# Patient Record
Sex: Female | Born: 1970 | Race: White | Hispanic: No | Smoking: Current every day smoker
Health system: Southern US, Community
[De-identification: ages and names within clinical notes are randomized; demographics above are authoritative.]

## PROBLEM LIST (undated history)

## (undated) DIAGNOSIS — K602 Anal fissure, unspecified: Secondary | ICD-10-CM

## (undated) DIAGNOSIS — J45909 Unspecified asthma, uncomplicated: Secondary | ICD-10-CM

## (undated) DIAGNOSIS — K635 Polyp of colon: Secondary | ICD-10-CM

## (undated) DIAGNOSIS — F32A Depression, unspecified: Secondary | ICD-10-CM

## (undated) DIAGNOSIS — D649 Anemia, unspecified: Secondary | ICD-10-CM

## (undated) DIAGNOSIS — E669 Obesity, unspecified: Secondary | ICD-10-CM

## (undated) DIAGNOSIS — K449 Diaphragmatic hernia without obstruction or gangrene: Secondary | ICD-10-CM

## (undated) DIAGNOSIS — Z86718 Personal history of other venous thrombosis and embolism: Secondary | ICD-10-CM

## (undated) DIAGNOSIS — F419 Anxiety disorder, unspecified: Secondary | ICD-10-CM

## (undated) DIAGNOSIS — I2699 Other pulmonary embolism without acute cor pulmonale: Secondary | ICD-10-CM

## (undated) DIAGNOSIS — I10 Essential (primary) hypertension: Secondary | ICD-10-CM

## (undated) DIAGNOSIS — F329 Major depressive disorder, single episode, unspecified: Secondary | ICD-10-CM

## (undated) DIAGNOSIS — J189 Pneumonia, unspecified organism: Secondary | ICD-10-CM

## (undated) DIAGNOSIS — K8020 Calculus of gallbladder without cholecystitis without obstruction: Secondary | ICD-10-CM

## (undated) HISTORY — PX: CHOLECYSTECTOMY: SHX55

## (undated) HISTORY — PX: TUBAL LIGATION: SHX77

## (undated) HISTORY — PX: ABLATION ON ENDOMETRIOSIS: SHX5787

## (undated) HISTORY — PX: ANKLE FRACTURE SURGERY: SHX122

## (undated) HISTORY — DX: Pneumonia, unspecified organism: J18.9

## (undated) HISTORY — DX: Diaphragmatic hernia without obstruction or gangrene: K44.9

## (undated) HISTORY — DX: Polyp of colon: K63.5

## (undated) HISTORY — DX: Calculus of gallbladder without cholecystitis without obstruction: K80.20

## (undated) HISTORY — DX: Obesity, unspecified: E66.9

## (undated) HISTORY — DX: Anemia, unspecified: D64.9

## (undated) HISTORY — DX: Anal fissure, unspecified: K60.2

## (undated) HISTORY — DX: Personal history of other venous thrombosis and embolism: Z86.718

## (undated) HISTORY — PX: UPPER GI ENDOSCOPY: SHX6162

---

## 1995-08-15 DIAGNOSIS — K8020 Calculus of gallbladder without cholecystitis without obstruction: Secondary | ICD-10-CM

## 1995-08-15 HISTORY — DX: Calculus of gallbladder without cholecystitis without obstruction: K80.20

## 2001-08-14 DIAGNOSIS — Z86718 Personal history of other venous thrombosis and embolism: Secondary | ICD-10-CM

## 2001-08-14 DIAGNOSIS — D649 Anemia, unspecified: Secondary | ICD-10-CM

## 2001-08-14 HISTORY — DX: Anemia, unspecified: D64.9

## 2001-08-14 HISTORY — PX: COLONOSCOPY: SHX174

## 2001-08-14 HISTORY — DX: Personal history of other venous thrombosis and embolism: Z86.718

## 2002-08-14 DIAGNOSIS — K449 Diaphragmatic hernia without obstruction or gangrene: Secondary | ICD-10-CM

## 2002-08-14 HISTORY — DX: Diaphragmatic hernia without obstruction or gangrene: K44.9

## 2003-08-15 DIAGNOSIS — K602 Anal fissure, unspecified: Secondary | ICD-10-CM

## 2003-08-15 DIAGNOSIS — K635 Polyp of colon: Secondary | ICD-10-CM

## 2003-08-15 HISTORY — DX: Polyp of colon: K63.5

## 2003-08-15 HISTORY — DX: Anal fissure, unspecified: K60.2

## 2008-08-14 HISTORY — PX: HIP FRACTURE SURGERY: SHX118

## 2013-07-14 DIAGNOSIS — J189 Pneumonia, unspecified organism: Secondary | ICD-10-CM

## 2013-07-14 HISTORY — DX: Pneumonia, unspecified organism: J18.9

## 2014-04-27 ENCOUNTER — Emergency Department (HOSPITAL_COMMUNITY): Payer: Self-pay

## 2014-04-27 ENCOUNTER — Encounter (HOSPITAL_COMMUNITY): Payer: Self-pay | Admitting: Emergency Medicine

## 2014-04-27 ENCOUNTER — Emergency Department (HOSPITAL_COMMUNITY)
Admission: EM | Admit: 2014-04-27 | Discharge: 2014-04-27 | Disposition: A | Discharge status: Home / Self Care | Payer: Self-pay | Attending: Emergency Medicine | Admitting: Emergency Medicine

## 2014-04-27 DIAGNOSIS — Z86711 Personal history of pulmonary embolism: Secondary | ICD-10-CM | POA: Insufficient documentation

## 2014-04-27 DIAGNOSIS — R079 Chest pain, unspecified: Secondary | ICD-10-CM | POA: Insufficient documentation

## 2014-04-27 DIAGNOSIS — J069 Acute upper respiratory infection, unspecified: Secondary | ICD-10-CM | POA: Insufficient documentation

## 2014-04-27 DIAGNOSIS — J45901 Unspecified asthma with (acute) exacerbation: Secondary | ICD-10-CM | POA: Insufficient documentation

## 2014-04-27 DIAGNOSIS — Z79899 Other long term (current) drug therapy: Secondary | ICD-10-CM | POA: Insufficient documentation

## 2014-04-27 DIAGNOSIS — B9789 Other viral agents as the cause of diseases classified elsewhere: Secondary | ICD-10-CM

## 2014-04-27 DIAGNOSIS — Z7901 Long term (current) use of anticoagulants: Secondary | ICD-10-CM | POA: Insufficient documentation

## 2014-04-27 DIAGNOSIS — I10 Essential (primary) hypertension: Secondary | ICD-10-CM | POA: Insufficient documentation

## 2014-04-27 DIAGNOSIS — R072 Precordial pain: Secondary | ICD-10-CM | POA: Insufficient documentation

## 2014-04-27 DIAGNOSIS — F17200 Nicotine dependence, unspecified, uncomplicated: Secondary | ICD-10-CM | POA: Insufficient documentation

## 2014-04-27 HISTORY — DX: Unspecified asthma, uncomplicated: J45.909

## 2014-04-27 HISTORY — DX: Other pulmonary embolism without acute cor pulmonale: I26.99

## 2014-04-27 HISTORY — DX: Essential (primary) hypertension: I10

## 2014-04-27 LAB — BASIC METABOLIC PANEL
ANION GAP: 11 (ref 5–15)
BUN: 9 mg/dL (ref 6–23)
CHLORIDE: 98 meq/L (ref 96–112)
CO2: 28 meq/L (ref 19–32)
Calcium: 9.1 mg/dL (ref 8.4–10.5)
Creatinine, Ser: 0.6 mg/dL (ref 0.50–1.10)
GFR calc Af Amer: >90 mL/min (ref >90)
GFR calc non Af Amer: >90 mL/min (ref >90)
GLUCOSE: 112 mg/dL — AB (ref 70–99)
Potassium: 4.2 mEq/L (ref 3.7–5.3)
Sodium: 137 mEq/L (ref 137–147)

## 2014-04-27 LAB — PRO B NATRIURETIC PEPTIDE: PRO B NATRI PEPTIDE: 65.4 pg/mL (ref 0–125)

## 2014-04-27 LAB — CBC
HCT: 41 % (ref 36.0–46.0)
HEMOGLOBIN: 13.6 g/dL (ref 12.0–15.0)
MCH: 31.1 pg (ref 26.0–34.0)
MCHC: 33.2 g/dL (ref 30.0–36.0)
MCV: 93.6 fL (ref 78.0–100.0)
PLATELETS: 291 10*3/uL (ref 150–400)
RBC: 4.38 MIL/uL (ref 3.87–5.11)
RDW: 14.6 % (ref 11.5–15.5)
WBC: 8.1 10*3/uL (ref 4.0–10.5)

## 2014-04-27 LAB — PROTIME-INR
INR: 1.58 — AB (ref 0.00–1.49)
PROTHROMBIN TIME: 18.9 s — AB (ref 11.6–15.2)

## 2014-04-27 LAB — I-STAT TROPONIN, ED: TROPONIN I, POC: 0 ng/mL (ref 0.00–0.08)

## 2014-04-27 MED ORDER — SULFAMETHOXAZOLE-TMP DS 800-160 MG PO TABS
1.0000 | ORAL_TABLET | Freq: Once | ORAL | Status: AC
  Administered 2014-04-27: 1 via ORAL
  Filled 2014-04-27: qty 1

## 2014-04-27 MED ORDER — ALBUTEROL SULFATE HFA 108 (90 BASE) MCG/ACT IN AERS: 2.0000 | INHALATION_SPRAY | Freq: Four times a day (QID) | RESPIRATORY_TRACT | Status: AC | PRN

## 2014-04-27 MED ORDER — ALBUTEROL SULFATE (2.5 MG/3ML) 0.083% IN NEBU
5.0000 mg | INHALATION_SOLUTION | Freq: Once | RESPIRATORY_TRACT | Status: AC
  Administered 2014-04-27: 5 mg via RESPIRATORY_TRACT
  Filled 2014-04-27: qty 6

## 2014-04-27 MED ORDER — ALBUTEROL SULFATE (2.5 MG/3ML) 0.083% IN NEBU: 5.0000 mg | INHALATION_SOLUTION | Freq: Once | RESPIRATORY_TRACT | Status: DC

## 2014-04-27 MED ORDER — DEXAMETHASONE SODIUM PHOSPHATE 10 MG/ML IJ SOLN
8.0000 mg | Freq: Once | INTRAMUSCULAR | Status: AC
  Administered 2014-04-27: 8 mg via INTRAMUSCULAR
  Filled 2014-04-27: qty 1

## 2014-04-27 MED ORDER — IPRATROPIUM-ALBUTEROL 0.5-2.5 (3) MG/3ML IN SOLN
3.0000 mL | Freq: Once | RESPIRATORY_TRACT | Status: AC
  Administered 2014-04-27: 3 mL via RESPIRATORY_TRACT
  Filled 2014-04-27: qty 3

## 2014-04-27 MED ORDER — IBUPROFEN 400 MG PO TABS
600.0000 mg | ORAL_TABLET | Freq: Once | ORAL | Status: DC
  Filled 2014-04-27 (×2): qty 1

## 2014-04-27 MED ORDER — ENOXAPARIN SODIUM 120 MG/0.8ML ~~LOC~~ SOLN
1.0000 mg/kg | Freq: Once | SUBCUTANEOUS | Status: AC
  Administered 2014-04-27: 110 mg via SUBCUTANEOUS
  Filled 2014-04-27: qty 0.8

## 2014-04-27 MED ORDER — ENOXAPARIN SODIUM 100 MG/ML ~~LOC~~ SOLN
107.0000 mg | Freq: Once | SUBCUTANEOUS | Status: DC
  Filled 2014-04-27: qty 2

## 2014-04-27 MED ORDER — HYDROCODONE-ACETAMINOPHEN 5-325 MG PO TABS
2.0000 | ORAL_TABLET | Freq: Once | ORAL | Status: AC
  Administered 2014-04-27: 2 via ORAL
  Filled 2014-04-27: qty 2

## 2014-04-27 MED ORDER — RIVAROXABAN 20 MG PO TABS
20.0000 mg | ORAL_TABLET | Freq: Every day | ORAL | Status: DC
Start: 2014-04-27 — End: 2014-07-27

## 2014-04-27 NOTE — ED Provider Notes (Signed)
Medical screening examination/treatment/procedure(s) were conducted as a shared visit with non-physician practitioner(s) or resident  and myself.  I personally evaluated the patient during the encounter and agree with the findings.   I have personally reviewed any xrays and/ or EKG's with the provider and I agree with interpretation.   Patient with gradually worsening cough, lower chest burning, bodyaches, subjective fever, chills, sick contact recently. On exam patient has expiratory wheezes bilateral, no distress, heart rate normal during my exam, no murmurs appreciated, abdomen soft nontender. Clinically patient with asthma  exacerbation/upper rest or infection. Chest x-ray reviewed no acute findings. Patient improved in ER with neb. Patient having financial difficulties and social work evaluated to help outpatient. plan for steroids, albuterol and outpatient followup. Clinically patient asthma exacerbation/upper respiratory infection. Social work made urgent appointment with local primary Dr. to ensure improvement of INR. Lovenox ordered in ER. Pt will fup with her hematologist to clarify which anticoagulant to continue.   Results and differential diagnosis were discussed with the patient/parent/guardian. Close follow up outpatient was discussed, comfortable with the plan.   Medications  enoxaparin (LOVENOX) injection 105 mg (not administered)  ipratropium-albuterol (DUONEB) 0.5-2.5 (3) MG/3ML nebulizer solution 3 mL (3 mLs Nebulization Given 04/27/14 1406)  HYDROcodone-acetaminophen (NORCO/VICODIN) 5-325 MG per tablet 2 tablet (2 tablets Oral Given 04/27/14 1450)  sulfamethoxazole-trimethoprim (BACTRIM DS) 800-160 MG per tablet 1 tablet (1 tablet Oral Given 04/27/14 1539)  albuterol (PROVENTIL) (2.5 MG/3ML) 0.083% nebulizer solution 5 mg (5 mg Nebulization Given 04/27/14 1545)  dexamethasone (DECADRON) injection 8 mg (8 mg Intramuscular Given 04/27/14 1600)  enoxaparin (LOVENOX) injection 110 mg (110  mg Subcutaneous Given 04/27/14 1637)    Filed Vitals:   04/27/14 1547 04/27/14 1559 04/27/14 1600 04/27/14 1639  BP: 103/67  112/77 116/68  Pulse: 67  77 82  Temp:    98.1 F (36.7 C)  TempSrc:    Oral  Resp: 15   18  Weight:  236 lb 12.8 oz (107.412 kg)    SpO2: 94%  90% 94%       Mariea Clonts, MD 04/27/14 1659

## 2014-04-27 NOTE — Progress Notes (Signed)
ED CM consulted with patient concerning fu care. Patient presented to [facility] ED with c/o SOB, CP with  Hx of PE on coumadin.  Spoke with patient she reports recently starting a new job,m insurance has not gone into effective. Patient does not have a PCP. Offered to assist patient with finding a PCP and anticoagulation meds. Patient is agreeable with disposition plan. Scheduled an appt. with [facility] for Tuesday 9/22 at [place] with  Dr.Zavitz regarding bridging to xarelto, he is agreeable. Provided patient with 30 day free xarelto card.  Reviewed clinic and xarelto information with patient,. She verbalized understanding teach back done. Updated Dr. Reather Converse and Benjamine Mola RN with disposition plan. No further ED CM needs identiifed

## 2014-04-27 NOTE — ED Provider Notes (Signed)
CSN: [id number]     Arrival date & time 04/27/14  1046 History   First MD Initiated Contact with Patient 04/27/14 1346     Chief Complaint  Patient presents with  . Chest Pain  . Shortness of Breath    (Consider location/radiation/quality/duration/timing/severity/associated sxs/prior Treatment) Patient is a 43 y.o. female presenting with chest pain. The history is provided by the patient.  Chest Pain Pain location:  Substernal area Pain quality: burning and pressure   Pain radiates to:  Does not radiate Pain radiates to the back: no   Pain severity:  Moderate Onset quality:  Gradual Duration:  2 days Progression:  Waxing and waning Context: breathing, at rest and stress   Context: not eating and no movement   Relieved by:  Nothing Worsened by:  Coughing and deep breathing Ineffective treatments: OTC cold meds and albuterol MDI. Associated symptoms: anxiety, cough, heartburn, orthopnea, shortness of breath and weakness   Associated symptoms: no altered mental status, no back pain, no dizziness, no fatigue, no fever, no lower extremity edema, no nausea, no numbness and no palpitations   Risk factors: hypertension, obesity, prior DVT/PE and smoking   Risk factors: no aortic disease, no birth control, no coronary artery disease, no diabetes mellitus, no high cholesterol and not pregnant     Past Medical History  Diagnosis Date  . PE (pulmonary embolism)   . Asthma   . Hypertension    History reviewed. No pertinent past surgical history. History reviewed. No pertinent family history. History  Substance Use Topics  . Smoking status: Current Every Day Smoker  . Smokeless tobacco: Not on file  . Alcohol Use: No   OB History   Grav Para Term Preterm Abortions TAB SAB Ect Mult Living                 Review of Systems  Constitutional: Negative for fever, chills, appetite change and fatigue.  HENT: Positive for congestion, postnasal drip and sinus pressure.   Respiratory:  Positive for cough, shortness of breath and wheezing. Negative for chest tightness.   Cardiovascular: Positive for chest pain and orthopnea. Negative for palpitations and leg swelling.  Gastrointestinal: Positive for heartburn. Negative for nausea.  Musculoskeletal: Negative for back pain.  Skin: Negative for rash.  Neurological: Positive for weakness. Negative for dizziness and numbness.      Allergies  Levaquin  Home Medications   Prior to Admission medications   Medication Sig Start Date End Date Taking? Authorizing Provider  diazepam (VALIUM) 10 MG tablet Take 10 mg by mouth every 6 (six) hours as needed for anxiety.   Yes Historical Provider, MD  escitalopram (LEXAPRO) 20 MG tablet Take 20 mg by mouth daily.   Yes Historical Provider, MD  lisinopril-hydrochlorothiazide (PRINZIDE,ZESTORETIC) 20-12.5 MG per tablet Take 1 tablet by mouth 2 (two) times daily.   Yes Historical Provider, MD  warfarin (COUMADIN) 10 MG tablet Take 10 mg by mouth daily.   Yes Historical Provider, MD  albuterol (PROVENTIL HFA;VENTOLIN HFA) 108 (90 BASE) MCG/ACT inhaler Inhale 2 puffs into the lungs every 6 (six) hours as needed for wheezing or shortness of breath. 04/27/14   Olam Idler, MD  albuterol (PROVENTIL) (2.5 MG/3ML) 0.083% nebulizer solution Take 6 mLs (5 mg total) by nebulization once. 04/27/14   Olam Idler, MD  rivaroxaban (XARELTO) 20 MG TABS tablet Take 1 tablet (20 mg total) by mouth daily with supper. 04/27/14   Olam Idler, MD   BP 103/67  Pulse 67  Temp(Src) 98.3 F (36.8 C) (Oral)  Resp 15  SpO2 94%  LMP 04/20/2014 Physical Exam  Vitals reviewed. Constitutional: She is oriented to person, place, and time. She appears well-developed and well-nourished. No distress.  HENT:  Mouth/Throat: Oropharynx is clear and moist.  Eyes: EOM are normal. Pupils are equal, round, and reactive to light.  Cardiovascular: Normal rate, regular rhythm and normal heart sounds.   No murmur  heard. Pulmonary/Chest: Effort normal. No respiratory distress. She has wheezes.  Abdominal: Soft. There is no tenderness.  Musculoskeletal: Normal range of motion. She exhibits no edema.  Neurological: She is alert and oriented to person, place, and time.  Skin: Skin is warm. No rash noted. She is not diaphoretic.    ED Course  Procedures (including critical care time) Labs Review Labs Reviewed  BASIC METABOLIC PANEL - Abnormal; Notable for the following:    Glucose, Bld 112 (*)    All other components within normal limits  PROTIME-INR - Abnormal; Notable for the following:    Prothrombin Time 18.9 (*)    INR 1.58 (*)    All other components within normal limits  CBC  PRO B NATRIURETIC PEPTIDE  I-STAT TROPOININ, ED    Imaging Review Dg Chest 2 View  04/27/2014   CLINICAL DATA:  Shortness of breath.  Asthma.  EXAM: CHEST  2 VIEW  COMPARISON:  06/15/2009.  FINDINGS: The cardiac silhouette, mediastinal and hilar contours are within normal limits. There is mild tortuosity of the thoracic aorta. The lungs are clear of acute process. No pleural effusion. There is mild eventration of the left hemidiaphragm. The bony thorax is intact.  IMPRESSION: No acute cardiopulmonary findings.   Electronically Signed   By: Kalman Jewels M.D.   On: 04/27/2014 12:20     EKG Interpretation   Date/Time:  Monday April 27 2014 10:51:46 EDT Ventricular Rate:  102 PR Interval:  116 QRS Duration: 78 QT Interval:  348 QTC Calculation: 453 R Axis:   63 Text Interpretation:  Sinus tachycardia Otherwise normal ECG Confirmed by  ZAVITZ  MD, JOSHUA ([id number]) on 04/27/2014 3:09:48 PM      MDM   Final diagnoses:  Chest pain, unspecified chest pain type  Asthma exacerbation  Viral URI with cough   Asthma exacerbation: Patient presents with viral URI symptoms x3 days, associated with substernal chest pain worse with coughing and deep breaths, as well as shortness of breath and wheezing.  Patient is  afebrile with normal WBC and chest x-ray negative for pneumonia.  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  INR currently subtherapeutic but report compliance with Coumadin and stable INR in past. Patient recently moved back to [place] due to new job and is currently without insurance. Initial tachycardia and hypoxia improved with DuoNebs.  Patient given dexamethasone and second dose of Duonebs prior to discharge.  Discussed, subtherapeutic INR.  The patient opted [facility] to [facility] for free 30 day prescription for Xarelto. She will establish PCP and follow-up with them for ongoing anticoagulation.  Patient given refills of albuterol MDI and nebulizer for asthma management.    Olam Idler, MD 04/27/14 1548  Olam Idler, MD 04/27/14 [phone]

## 2014-04-27 NOTE — ED Notes (Signed)
Per pt sts cold x 1 week and now has developed chest pressure, and SOB. sts feels like she cant get a deep breath. sts hx of PE.

## 2014-04-27 NOTE — Discharge Planning (Signed)
[place] to patient regarding primary care resources and establishing care with a provider. Patient states she should be obtaining insurance in the upcoming weeks through her employer. Resource guide and my contact information provided for any future questions or concerns. Spoke with Mariann Laster, Case Management about providing help with medication assistance.

## 2014-04-27 NOTE — Discharge Instructions (Signed)
Your chest pain and shortness of breath is due to asthma exacerbation likely to viral infection and cigarette smoke. This does not require antibiotics. You should continue to use you albuterol as needed and would recommend smoking cessation. Stop coumadin once you start Xarelto for your anticoagulation. Please establish a primary care doctor and follow-up with them from ongoing anticoagulation needs.   Asthma, Acute Bronchospasm Acute bronchospasm caused by asthma is also referred to as an asthma attack. Bronchospasm means your air passages become narrowed. The narrowing is caused by inflammation and tightening of the muscles in the air tubes (bronchi) in your lungs. This can make it hard to breathe or cause you to wheeze and cough. CAUSES Possible triggers are:  Animal dander from the skin, hair, or feathers of animals.  Dust mites contained in house dust.  Cockroaches.  Pollen from trees or grass.  Mold.  Cigarette or tobacco smoke.  Air pollutants such as dust, household cleaners, hair sprays, aerosol sprays, paint fumes, strong chemicals, or strong odors.  Cold air or weather changes. Cold air may trigger inflammation. Winds increase molds and pollens in the air.  Strong emotions such as crying or laughing hard.  Stress.  Certain medicines such as aspirin or beta-blockers.  Sulfites in foods and drinks, such as dried fruits and wine.  Infections or inflammatory conditions, such as a flu, cold, or inflammation of the nasal membranes (rhinitis).  Gastroesophageal reflux disease (GERD). GERD is a condition where stomach acid backs up into your esophagus.  Exercise or strenuous activity. SIGNS AND SYMPTOMS   Wheezing.  Excessive coughing, particularly at night.  Chest tightness.  Shortness of breath. DIAGNOSIS  Your health care provider will ask you about your medical history and perform a physical exam. A chest X-ray or blood testing may be performed to look for other  causes of your symptoms or other conditions that may have triggered your asthma attack. TREATMENT  Treatment is aimed at reducing inflammation and opening up the airways in your lungs. Most asthma attacks are treated with inhaled medicines. These include quick relief or rescue medicines (such as bronchodilators) and controller medicines (such as inhaled corticosteroids). These medicines are sometimes given through an inhaler or a nebulizer. Systemic steroid medicine taken by mouth or given through an IV tube also can be used to reduce the inflammation when an attack is moderate or severe. Antibiotic medicines are only used if a bacterial infection is present.  HOME CARE INSTRUCTIONS   Rest.  Drink plenty of liquids. This helps the mucus to remain thin and be easily coughed up. Only use caffeine in moderation and do not use alcohol until you have recovered from your illness.  Do not smoke. Avoid being exposed to secondhand smoke.  You play a critical role in keeping yourself in good health. Avoid exposure to things that cause you to wheeze or to have breathing problems.  Keep your medicines up-to-date and available. Carefully follow your health care provider's treatment plan.  Take your medicine exactly as prescribed.  When pollen or pollution is bad, keep windows closed and use an air conditioner or go to places with air conditioning.  Asthma requires careful medical care. See your health care provider for a follow-up as advised. If you are more than [redacted] weeks pregnant and you were prescribed any new medicines, let your obstetrician know about the visit and how you are doing. Follow up with your health care provider as directed.  After you have recovered from your asthma  attack, make an appointment with your outpatient doctor to talk about ways to reduce the likelihood of future attacks. If you do not have a doctor who manages your asthma, make an appointment with a primary care doctor to  discuss your asthma. SEEK IMMEDIATE MEDICAL CARE IF:   You are getting worse.  You have trouble breathing. If severe, call your local emergency services (911 in the [place]  You develop chest pain or discomfort.  You are vomiting.  You are not able to keep fluids down.  You are coughing up yellow, green, brown, or bloody sputum.  You have a fever and your symptoms suddenly get worse.  You have trouble swallowing. MAKE SURE YOU:   Understand these instructions.  Will watch your condition.  Will get help right away if you are not doing well or get worse. Document Released: 11/15/2006 Document Revised: 08/05/2013 Document Reviewed: 02/05/2013 [facility] Patient Information 2015 [place], [place]. This information is not intended to replace advice given to you by your health care provider. Make sure you discuss any questions you have with your health care provider.

## 2014-05-05 ENCOUNTER — Ambulatory Visit: Payer: Self-pay | Admitting: Family Medicine

## 2014-06-11 ENCOUNTER — Emergency Department (HOSPITAL_COMMUNITY): Payer: Self-pay

## 2014-06-11 ENCOUNTER — Encounter (HOSPITAL_COMMUNITY): Payer: Self-pay | Admitting: Emergency Medicine

## 2014-06-11 ENCOUNTER — Inpatient Hospital Stay (HOSPITAL_COMMUNITY)
Admission: EM | Admit: 2014-06-11 | Discharge: 2014-06-14 | DRG: 354 | Disposition: A | Discharge status: Home / Self Care | Payer: Self-pay | Attending: Surgery | Admitting: Surgery

## 2014-06-11 DIAGNOSIS — Z79899 Other long term (current) drug therapy: Secondary | ICD-10-CM

## 2014-06-11 DIAGNOSIS — F329 Major depressive disorder, single episode, unspecified: Secondary | ICD-10-CM | POA: Diagnosis present

## 2014-06-11 DIAGNOSIS — K430 Incisional hernia with obstruction, without gangrene: Secondary | ICD-10-CM | POA: Diagnosis present

## 2014-06-11 DIAGNOSIS — Z86711 Personal history of pulmonary embolism: Secondary | ICD-10-CM

## 2014-06-11 DIAGNOSIS — Z01818 Encounter for other preprocedural examination: Secondary | ICD-10-CM

## 2014-06-11 DIAGNOSIS — J45909 Unspecified asthma, uncomplicated: Secondary | ICD-10-CM | POA: Diagnosis present

## 2014-06-11 DIAGNOSIS — K436 Other and unspecified ventral hernia with obstruction, without gangrene: Principal | ICD-10-CM | POA: Diagnosis present

## 2014-06-11 DIAGNOSIS — K449 Diaphragmatic hernia without obstruction or gangrene: Secondary | ICD-10-CM | POA: Diagnosis present

## 2014-06-11 DIAGNOSIS — F419 Anxiety disorder, unspecified: Secondary | ICD-10-CM | POA: Diagnosis present

## 2014-06-11 DIAGNOSIS — Z6841 Body Mass Index (BMI) 40.0 and over, adult: Secondary | ICD-10-CM

## 2014-06-11 DIAGNOSIS — F17210 Nicotine dependence, cigarettes, uncomplicated: Secondary | ICD-10-CM | POA: Diagnosis present

## 2014-06-11 DIAGNOSIS — Z7901 Long term (current) use of anticoagulants: Secondary | ICD-10-CM

## 2014-06-11 DIAGNOSIS — K5900 Constipation, unspecified: Secondary | ICD-10-CM | POA: Diagnosis present

## 2014-06-11 DIAGNOSIS — K439 Ventral hernia without obstruction or gangrene: Secondary | ICD-10-CM

## 2014-06-11 DIAGNOSIS — I10 Essential (primary) hypertension: Secondary | ICD-10-CM | POA: Diagnosis present

## 2014-06-11 LAB — CBC WITH DIFFERENTIAL/PLATELET
BASOS PCT: 1 % (ref 0–1)
Basophils Absolute: 0.1 10*3/uL (ref 0.0–0.1)
Eosinophils Absolute: 0.1 10*3/uL (ref 0.0–0.7)
Eosinophils Relative: 1 % (ref 0–5)
HEMATOCRIT: 43.6 % (ref 36.0–46.0)
Hemoglobin: 14.3 g/dL (ref 12.0–15.0)
LYMPHS PCT: 32 % (ref 12–46)
Lymphs Abs: 3.3 10*3/uL (ref 0.7–4.0)
MCH: 31.6 pg (ref 26.0–34.0)
MCHC: 32.8 g/dL (ref 30.0–36.0)
MCV: 96.2 fL (ref 78.0–100.0)
MONO ABS: 0.6 10*3/uL (ref 0.1–1.0)
Monocytes Relative: 6 % (ref 3–12)
Neutro Abs: 6.3 10*3/uL (ref 1.7–7.7)
Neutrophils Relative %: 60 % (ref 43–77)
Platelets: 310 10*3/uL (ref 150–400)
RBC: 4.53 MIL/uL (ref 3.87–5.11)
RDW: 13.9 % (ref 11.5–15.5)
WBC: 10.4 10*3/uL (ref 4.0–10.5)

## 2014-06-11 LAB — URINALYSIS, ROUTINE W REFLEX MICROSCOPIC
Bilirubin Urine: NEGATIVE
GLUCOSE, UA: NEGATIVE mg/dL
Hgb urine dipstick: NEGATIVE
Ketones, ur: NEGATIVE mg/dL
LEUKOCYTES UA: NEGATIVE
Nitrite: NEGATIVE
PROTEIN: NEGATIVE mg/dL
Specific Gravity, Urine: 1.025 (ref 1.005–1.030)
Urobilinogen, UA: 0.2 mg/dL (ref 0.0–1.0)
pH: 5.5 (ref 5.0–8.0)

## 2014-06-11 LAB — COMPREHENSIVE METABOLIC PANEL
ALK PHOS: 63 U/L (ref 39–117)
ALT: 51 U/L — AB (ref 0–35)
AST: 41 U/L — ABNORMAL HIGH (ref 0–37)
Albumin: 3.5 g/dL (ref 3.5–5.2)
Anion gap: 11 (ref 5–15)
BILIRUBIN TOTAL: 0.3 mg/dL (ref 0.3–1.2)
BUN: 9 mg/dL (ref 6–23)
CHLORIDE: 102 meq/L (ref 96–112)
CO2: 27 meq/L (ref 19–32)
Calcium: 9.2 mg/dL (ref 8.4–10.5)
Creatinine, Ser: 0.65 mg/dL (ref 0.50–1.10)
GFR calc Af Amer: >90 mL/min (ref >90)
GLUCOSE: 88 mg/dL (ref 70–99)
Potassium: 4.6 mEq/L (ref 3.7–5.3)
SODIUM: 140 meq/L (ref 137–147)
Total Protein: 7.1 g/dL (ref 6.0–8.3)

## 2014-06-11 LAB — LIPASE, BLOOD: Lipase: 50 U/L (ref 11–59)

## 2014-06-11 LAB — OCCULT BLOOD X 1 CARD TO LAB, STOOL: Fecal Occult Bld: NEGATIVE

## 2014-06-11 LAB — I-STAT CG4 LACTIC ACID, ED: Lactic Acid, Venous: 1.4 mmol/L (ref 0.5–2.2)

## 2014-06-11 LAB — I-STAT BETA HCG BLOOD, ED (MC, WL, AP ONLY): I-stat hCG, quantitative: <5 m[IU]/mL (ref <5)

## 2014-06-11 LAB — TYPE AND SCREEN
ABO/RH(D): A POS
ANTIBODY SCREEN: NEGATIVE

## 2014-06-11 LAB — ABO/RH: ABO/RH(D): A POS

## 2014-06-11 MED ORDER — SODIUM CHLORIDE 0.9 % IV SOLN
1000.0000 mL | INTRAVENOUS | Status: DC
  Administered 2014-06-11: 1000 mL via INTRAVENOUS

## 2014-06-11 MED ORDER — ESCITALOPRAM OXALATE 20 MG PO TABS
20.0000 mg | ORAL_TABLET | Freq: Every day | ORAL | Status: DC
  Administered 2014-06-11 – 2014-06-14 (×3): 20 mg via ORAL
  Filled 2014-06-11 (×3): qty 1
  Filled 2014-06-11: qty 2

## 2014-06-11 MED ORDER — ONDANSETRON HCL 4 MG/2ML IJ SOLN
4.0000 mg | Freq: Four times a day (QID) | INTRAMUSCULAR | Status: DC | PRN
  Administered 2014-06-12 – 2014-06-13 (×3): 4 mg via INTRAVENOUS
  Filled 2014-06-11 (×3): qty 2

## 2014-06-11 MED ORDER — SODIUM CHLORIDE 0.9 % IV SOLN
1000.0000 mL | Freq: Once | INTRAVENOUS | Status: AC
  Administered 2014-06-11: 1000 mL via INTRAVENOUS

## 2014-06-11 MED ORDER — ONDANSETRON HCL 4 MG/2ML IJ SOLN
4.0000 mg | Freq: Once | INTRAMUSCULAR | Status: AC
  Administered 2014-06-11: 4 mg via INTRAVENOUS
  Filled 2014-06-11: qty 2

## 2014-06-11 MED ORDER — ACETAMINOPHEN 650 MG RE SUPP: 650.0000 mg | Freq: Four times a day (QID) | RECTAL | Status: DC | PRN

## 2014-06-11 MED ORDER — HYDROMORPHONE HCL 1 MG/ML IJ SOLN
1.0000 mg | INTRAMUSCULAR | Status: AC | PRN
  Administered 2014-06-11 (×3): 1 mg via INTRAVENOUS
  Filled 2014-06-11 (×3): qty 1

## 2014-06-11 MED ORDER — HYDROCHLOROTHIAZIDE 12.5 MG PO CAPS
12.5000 mg | ORAL_CAPSULE | Freq: Two times a day (BID) | ORAL | Status: DC
  Administered 2014-06-11 – 2014-06-14 (×5): 12.5 mg via ORAL
  Filled 2014-06-11 (×9): qty 1

## 2014-06-11 MED ORDER — ALBUTEROL SULFATE (2.5 MG/3ML) 0.083% IN NEBU
2.0000 mL | INHALATION_SOLUTION | Freq: Four times a day (QID) | RESPIRATORY_TRACT | Status: DC | PRN
  Administered 2014-06-14: 2 mL via RESPIRATORY_TRACT
  Filled 2014-06-11: qty 3

## 2014-06-11 MED ORDER — IOHEXOL 300 MG/ML  SOLN
25.0000 mL | INTRAMUSCULAR | Status: DC | PRN
  Administered 2014-06-11: 25 mL via ORAL

## 2014-06-11 MED ORDER — ACETAMINOPHEN 325 MG PO TABS: 650.0000 mg | ORAL_TABLET | Freq: Four times a day (QID) | ORAL | Status: DC | PRN

## 2014-06-11 MED ORDER — LORAZEPAM 2 MG/ML IJ SOLN
0.5000 mg | INTRAMUSCULAR | Status: DC | PRN
  Administered 2014-06-12 – 2014-06-13 (×2): 0.5 mg via INTRAVENOUS
  Filled 2014-06-11 (×2): qty 1

## 2014-06-11 MED ORDER — PANTOPRAZOLE SODIUM 40 MG IV SOLR
40.0000 mg | Freq: Every day | INTRAVENOUS | Status: DC
  Administered 2014-06-11 – 2014-06-13 (×3): 40 mg via INTRAVENOUS
  Filled 2014-06-11 (×6): qty 40

## 2014-06-11 MED ORDER — IOHEXOL 300 MG/ML  SOLN
100.0000 mL | Freq: Once | INTRAMUSCULAR | Status: AC | PRN
  Administered 2014-06-11: 100 mL via INTRAVENOUS

## 2014-06-11 MED ORDER — LISINOPRIL-HYDROCHLOROTHIAZIDE 20-12.5 MG PO TABS: 1.0000 | ORAL_TABLET | Freq: Two times a day (BID) | ORAL | Status: DC

## 2014-06-11 MED ORDER — LISINOPRIL 20 MG PO TABS
20.0000 mg | ORAL_TABLET | Freq: Two times a day (BID) | ORAL | Status: DC
  Administered 2014-06-11 – 2014-06-14 (×6): 20 mg via ORAL
  Filled 2014-06-11 (×9): qty 1

## 2014-06-11 MED ORDER — SODIUM CHLORIDE 0.9 % IV SOLN
INTRAVENOUS | Status: DC
  Administered 2014-06-11 – 2014-06-12 (×3): via INTRAVENOUS

## 2014-06-11 MED ORDER — OXYCODONE-ACETAMINOPHEN 5-325 MG PO TABS
2.0000 | ORAL_TABLET | Freq: Once | ORAL | Status: AC
  Administered 2014-06-11: 2 via ORAL
  Filled 2014-06-11: qty 2

## 2014-06-11 MED ORDER — HYDROMORPHONE HCL 1 MG/ML IJ SOLN
1.0000 mg | INTRAMUSCULAR | Status: DC | PRN
  Administered 2014-06-11 – 2014-06-13 (×12): 1 mg via INTRAVENOUS
  Filled 2014-06-11 (×13): qty 1

## 2014-06-11 NOTE — H&P (Addendum)
Courtney [name] is an 43 y.o. female.   Chief Complaint: ab pain HPI: 89 yof who is obese presents with 3 day history of noting a right paraumbilical mass that has been tender. This has not gotten better. The mass has always been present.  She has not had any n/v.  She has been having bms although somewhat constipated. She is passing flatus.  She comes in due to pain not relieved at home.   She has a history of pe 10 years ago that is not known why. She has been on lifeling coumadin but switched one month ago to [employer].  She took that this am She also found out 10 years ago that she had a paraesophageal hernia and was recommended to have that fixed. She was anemic then and this was thought to possibly be due to there pe hernia.  She has not followed up for this. She does report early satiety and eructation with some difficult. She has bloating occasionally also. She has been treated in last six weeks for bronchitis here.  Past Medical History  Diagnosis Date  . PE (pulmonary embolism)   . Asthma   . Hypertension   depression/anxiety  PSH: endometrial ablation, btl, lap chole  History reviewed. No pertinent family history. Social History:  reports that she has been smoking.  She does not have any smokeless tobacco history on file. She reports that she does not drink alcohol. Her drug history is not on file.  Allergies:  Allergies  Allergen Reactions  . Levaquin [Levofloxacin In D5w] Hives    meds reviewed, include xarelto  Results for orders placed during the hospital encounter of 06/11/14 (from the past 48 hour(s))  COMPREHENSIVE METABOLIC PANEL     Status: Abnormal   Collection Time    06/11/14  4:36 PM      Result Value Ref Range   Sodium 140  137 - 147 mEq/L   Potassium 4.6  3.7 - 5.3 mEq/L   Chloride 102  96 - 112 mEq/L   CO2 27  19 - 32 mEq/L   Glucose, Bld 88  70 - 99 mg/dL   BUN 9  6 - 23 mg/dL   Creatinine, Ser 0.65  0.50 - 1.10 mg/dL   Calcium 9.2  8.4 - 10.5 mg/dL   Total Protein 7.1  6.0 - 8.3 g/dL   Albumin 3.5  3.5 - 5.2 g/dL   AST 41 (*) 0 - 37 U/L   ALT 51 (*) 0 - 35 U/L   Alkaline Phosphatase 63  39 - 117 U/L   Total Bilirubin 0.3  0.3 - 1.2 mg/dL   GFR calc non Af Amer >90  >90 mL/min   GFR calc Af Amer >90  >90 mL/min   Comment: (NOTE)     The eGFR has been calculated using the CKD EPI equation.     This calculation has not been validated in all clinical situations.     eGFR's persistently <90 mL/min signify possible Chronic Kidney     Disease.   Anion gap 11  5 - 15  CBC WITH DIFFERENTIAL     Status: None   Collection Time    06/11/14  4:36 PM      Result Value Ref Range   WBC 10.4  4.0 - 10.5 K/uL   RBC 4.53  3.87 - 5.11 MIL/uL   Hemoglobin 14.3  12.0 - 15.0 g/dL   HCT 43.6  36.0 - 46.0 %   MCV  96.2  78.0 - 100.0 fL   MCH 31.6  26.0 - 34.0 pg   MCHC 32.8  30.0 - 36.0 g/dL   RDW 13.9  11.5 - 15.5 %   Platelets 310  150 - 400 K/uL   Neutrophils Relative % 60  43 - 77 %   Neutro Abs 6.3  1.7 - 7.7 K/uL   Lymphocytes Relative 32  12 - 46 %   Lymphs Abs 3.3  0.7 - 4.0 K/uL   Monocytes Relative 6  3 - 12 %   Monocytes Absolute 0.6  0.1 - 1.0 K/uL   Eosinophils Relative 1  0 - 5 %   Eosinophils Absolute 0.1  0.0 - 0.7 K/uL   Basophils Relative 1  0 - 1 %   Basophils Absolute 0.1  0.0 - 0.1 K/uL  LIPASE, BLOOD     Status: None   Collection Time    06/11/14  4:36 PM      Result Value Ref Range   Lipase 50  11 - 59 U/L  TYPE AND SCREEN     Status: None   Collection Time    06/11/14  4:38 PM      Result Value Ref Range   ABO/RH(D) A POS     Antibody Screen NEG     Sample Expiration 06/14/2014    ABO/RH     Status: None   Collection Time    06/11/14  4:38 PM      Result Value Ref Range   ABO/RH(D) A POS    I-STAT CG4 LACTIC ACID, ED     Status: None   Collection Time    06/11/14  4:58 PM      Result Value Ref Range   Lactic Acid, Venous 1.40  0.5 - 2.2 mmol/L  OCCULT BLOOD X 1 CARD TO LAB, STOOL     Status: None    Collection Time    06/11/14  5:49 PM      Result Value Ref Range   Fecal Occult Bld NEGATIVE  NEGATIVE  I-STAT BETA HCG BLOOD, ED ([facility], [facility], [facility] ONLY)     Status: None   Collection Time    06/11/14  6:20 PM      Result Value Ref Range   I-stat hCG, quantitative <5.0  <5 mIU/mL   Comment 3            Comment:   GEST. AGE      CONC.  (mIU/mL)       <=1 WEEK        5 - 50         2 WEEKS       50 - 500         3 WEEKS       100 - 10,000         4 WEEKS     1,000 - 30,000                FEMALE AND NON-PREGNANT FEMALE:         LESS THAN 5 mIU/mL   Ct Abdomen Pelvis W Contrast  06/11/2014   CLINICAL DATA:  Right-sided abdominal pain and distention with nausea, vomiting, and diarrhea  EXAM: CT ABDOMEN AND PELVIS WITH CONTRAST  TECHNIQUE: Multidetector CT imaging of the abdomen and pelvis was performed using the standard protocol following bolus administration of intravenous contrast. Oral contrast was also administered.  CONTRAST:  166m OMNIPAQUE IOHEXOL 300 MG/ML  SOLN  COMPARISON:  None.  FINDINGS: There is a foramen of Magendie hernia on the left anteriorly. The stomach extends into this hernia. There is atelectatic change in the left lung base. Lung bases are otherwise clear.  The liver is enlarged measuring 19.8 cm in length. There is hepatic steatosis throughout much of the liver. Beyond the hepatic steatosis, no focal liver lesions are identified. Gallbladder is absent. There is no appreciable biliary duct dilatation.  Spleen, pancreas, and adrenals appear normal. Kidneys bilaterally show no appreciable mass or hydronephrosis on either side. There is no renal or ureteral calculus on either side.  There is a moderate ventral hernia containing fat and surgical clips but no bowel.  In the pelvis, the urinary bladder is midline with normal wall thickness. There is no pelvic mass or pelvic fluid collection. Appendix appears normal. Terminal ileum appears normal.  There is no bowel obstruction. No free  air or portal venous air. There is no appreciable ascites, adenopathy, or abscess in the abdomen or pelvis. There is no demonstrable abdominal aortic aneurysm. There is postoperative change in the left femur proximally. There are no blastic or lytic bone lesions.  IMPRESSION: There is a left-sided foramen of Magendie hernia anteriorly with the stomach extending into this hernia. There is no gastric compromise. There is left lung base atelectasis in this region.  Enlarged liver with hepatic steatosis.  Gallbladder absent.  No bowel obstruction. No abscess. Appendix appears normal. No renal or ureteral calculus. No hydronephrosis.  There is a ventral hernia containing only fat and surgical clips.   Electronically Signed   By: Lowella Grip M.D.   On: 06/11/2014 19:54   Dg Abd 2 Views  06/11/2014   CLINICAL DATA:  Abdominal pain for 2 days.  EXAM: ABDOMEN - 2 VIEW  COMPARISON:  None available.  FINDINGS: Supine and upright views of the abdomen demonstrate a nonspecific bowel gas pattern. There is no evidence for obstruction or free air. Surgical clips are present at the gallbladder fossa. The lung bases are clear. The heart size is normal. The axial skeleton is unremarkable.  IMPRESSION: Negative two view abdomen.   Electronically Signed   By: Lawrence Santiago M.D.   On: 06/11/2014 17:17    Review of Systems  Constitutional: Negative for fever, chills and weight loss.  Respiratory: Positive for cough (for past six weeks) and wheezing. Negative for shortness of breath.   Cardiovascular: Negative for chest pain and palpitations.  Gastrointestinal: Positive for abdominal pain and constipation. Negative for nausea, vomiting, diarrhea and blood in stool.  Neurological: Negative for headaches.  Psychiatric/Behavioral: The patient is nervous/anxious.     Blood pressure 114/63, pulse 70, temperature 98.1 F (36.7 C), temperature source Oral, resp. rate 25, height 5' 1"  (1.549 m), weight 230 lb (104.327 kg),  last menstrual period 05/28/2014, SpO2 93.00%. Physical Exam  Constitutional: She appears well-developed and well-nourished. No distress.  HENT:  Head: Normocephalic and atraumatic.  Eyes: No scleral icterus.  Neck: Neck supple.  Cardiovascular: Normal rate, regular rhythm and normal heart sounds.   Respiratory: Effort normal. She has wheezes (occ end exp).  GI: Soft. Bowel sounds are normal. There is tenderness. A hernia is present. Hernia confirmed positive in the ventral area.    Lymphadenopathy:    She has no cervical adenopathy.     Assessment/Plan Incarcerated incisional hernia History vte pe hernia  1. She has incarcerated incisional hernia with fat, no obstruction or concern for bowel compromise. With xarelto I think admission  and repair in 48 hours from last dose would be best unless there is clinical change. We discussed possible laparoscopic repair with mesh. 2. Will hold xarelto and plan surgery.  If not on xarelto any longer than 48 hours will need to be bridged. [place] hematologist is in another city ([place]).  Can consult here if needed. 3. PE hernia will need evaluation but this is not source of current pain and can be addressed later in elective fashion to maximize her pulmonary issues and try to get her to stop smoking 4. scds protonix 5. Will continue home antihtn 6. Will give ativan as needed for anxiety [place] 06/11/2014, 9:22 PM

## 2014-06-11 NOTE — ED Notes (Signed)
Pt in c/o abd pain with abdominal distention, states yesterday she was constipation and vomiting, today, she started having bowel movements that are loose and dark tarry, pt state she takes Alen Blew, continues to report n/v today

## 2014-06-11 NOTE — ED Notes (Signed)
Pt takes Blood Thinner. 1 MONTH since switch from coumadin to zarelto.

## 2014-06-11 NOTE — ED Notes (Signed)
Contrast complete. CT notified

## 2014-06-11 NOTE — ED Notes (Signed)
Occult blood collected at [facility]

## 2014-06-11 NOTE — ED Notes (Signed)
Attempted report 

## 2014-06-11 NOTE — ED Provider Notes (Signed)
CSN: [id number]     Arrival date & time 06/11/14  1609 History   First MD Initiated Contact with Patient 06/11/14 1750     Chief Complaint  Patient presents with  . Abdominal Pain    Patient is a 43 y.o. female presenting with abdominal pain. The history is provided by the patient.  Abdominal Pain Pain location:  RLQ Pain quality: aching   Pain radiates to:  Back Pain severity:  Severe Duration:  2 days Timing:  Constant Progression:  Worsening Relieved by:  Nothing Exacerbated by: coughing, vomiting. Ineffective treatments:  None tried Associated symptoms: anorexia, constipation, nausea and vomiting   Associated symptoms: no fever   Associated symptoms comment:  She has noticed dark black stools    Past Medical History  Diagnosis Date  . PE (pulmonary embolism)   . Asthma   . Hypertension    History reviewed. No pertinent past surgical history. History reviewed. No pertinent family history. History  Substance Use Topics  . Smoking status: Current Every Day Smoker  . Smokeless tobacco: Not on file  . Alcohol Use: No   OB History   Grav Para Term Preterm Abortions TAB SAB Ect Mult Living                 Review of Systems  Constitutional: Negative for fever.  Gastrointestinal: Positive for nausea, vomiting, abdominal pain, constipation and anorexia.  All other systems reviewed and are negative.     Allergies  Levaquin  Home Medications   Prior to Admission medications   Medication Sig Start Date End Date Taking? Authorizing Provider  albuterol (PROVENTIL HFA;VENTOLIN HFA) 108 (90 BASE) MCG/ACT inhaler Inhale 2 puffs into the lungs every 6 (six) hours as needed for wheezing or shortness of breath. 04/27/14  Yes Olam Idler, MD  albuterol (PROVENTIL) (2.5 MG/3ML) 0.083% nebulizer solution Take 6 mLs (5 mg total) by nebulization once. 04/27/14  Yes Olam Idler, MD  diazepam (VALIUM) 10 MG tablet Take 10 mg by mouth every 6 (six) hours as needed for  anxiety.   Yes Historical Provider, MD  escitalopram (LEXAPRO) 20 MG tablet Take 20 mg by mouth daily.   Yes Historical Provider, MD  lisinopril-hydrochlorothiazide (PRINZIDE,ZESTORETIC) 20-12.5 MG per tablet Take 1 tablet by mouth 2 (two) times daily.   Yes Historical Provider, MD  rivaroxaban (XARELTO) 20 MG TABS tablet Take 1 tablet (20 mg total) by mouth daily with supper. 04/27/14  Yes Olam Idler, MD   BP 114/63  Pulse 68  Temp(Src) 98.1 F (36.7 C) (Oral)  Resp 10  Ht 5\' 1"  (1.549 m)  Wt 230 lb (104.327 kg)  BMI 43.48 kg/m2  SpO2 94%  LMP 05/28/2014 Physical Exam  Nursing note and vitals reviewed. Constitutional: She appears well-developed and well-nourished. No distress.  HENT:  Head: Normocephalic and atraumatic.  Right Ear: External ear normal.  Left Ear: External ear normal.  Eyes: Conjunctivae are normal. Right eye exhibits no discharge. Left eye exhibits no discharge. No scleral icterus.  Neck: Neck supple. No tracheal deviation present.  Cardiovascular: Normal rate, regular rhythm and intact distal pulses.   Pulmonary/Chest: Effort normal and breath sounds normal. No stridor. No respiratory distress. She has no wheezes. She has no rales.  Abdominal: Soft. Bowel sounds are normal. She exhibits mass. She exhibits no distension. There is tenderness. There is no rebound and no guarding.    Musculoskeletal: She exhibits no edema and no tenderness.  Neurological: She is alert. She has  normal strength. No cranial nerve deficit (no facial droop, extraocular movements intact, no slurred speech) or sensory deficit. She exhibits normal muscle tone. She displays no seizure activity. Coordination normal.  Skin: Skin is warm and dry. No rash noted.  Psychiatric: She has a normal mood and affect.    ED Course  Procedures (including critical care time) Labs Review Labs Reviewed  COMPREHENSIVE METABOLIC PANEL - Abnormal; Notable for the following:    AST 41 (*)    ALT 51 (*)      All other components within normal limits  CBC WITH DIFFERENTIAL  LIPASE, BLOOD  OCCULT BLOOD X 1 CARD TO LAB, STOOL  URINALYSIS, ROUTINE W REFLEX MICROSCOPIC  I-STAT CG4 LACTIC ACID, ED  POC OCCULT BLOOD, ED  I-STAT BETA HCG BLOOD, ED ([facility], [facility], [facility] ONLY)  TYPE AND SCREEN  ABO/RH    Imaging Review Ct Abdomen Pelvis W Contrast  06/11/2014   CLINICAL DATA:  Right-sided abdominal pain and distention with nausea, vomiting, and diarrhea  EXAM: CT ABDOMEN AND PELVIS WITH CONTRAST  TECHNIQUE: Multidetector CT imaging of the abdomen and pelvis was performed using the standard protocol following bolus administration of intravenous contrast. Oral contrast was also administered.  CONTRAST:  163mL OMNIPAQUE IOHEXOL 300 MG/ML  SOLN  COMPARISON:  None.  FINDINGS: There is a foramen of Magendie hernia on the left anteriorly. The stomach extends into this hernia. There is atelectatic change in the left lung base. Lung bases are otherwise clear.  The liver is enlarged measuring 19.8 cm in length. There is hepatic steatosis throughout much of the liver. Beyond the hepatic steatosis, no focal liver lesions are identified. Gallbladder is absent. There is no appreciable biliary duct dilatation.  Spleen, pancreas, and adrenals appear normal. Kidneys bilaterally show no appreciable mass or hydronephrosis on either side. There is no renal or ureteral calculus on either side.  There is a moderate ventral hernia containing fat and surgical clips but no bowel.  In the pelvis, the urinary bladder is midline with normal wall thickness. There is no pelvic mass or pelvic fluid collection. Appendix appears normal. Terminal ileum appears normal.  There is no bowel obstruction. No free air or portal venous air. There is no appreciable ascites, adenopathy, or abscess in the abdomen or pelvis. There is no demonstrable abdominal aortic aneurysm. There is postoperative change in the left femur proximally. There are no blastic or lytic  bone lesions.  IMPRESSION: There is a left-sided foramen of Magendie hernia anteriorly with the stomach extending into this hernia. There is no gastric compromise. There is left lung base atelectasis in this region.  Enlarged liver with hepatic steatosis.  Gallbladder absent.  No bowel obstruction. No abscess. Appendix appears normal. No renal or ureteral calculus. No hydronephrosis.  There is a ventral hernia containing only fat and surgical clips.   Electronically Signed   By: Lowella Grip M.D.   On: 06/11/2014 19:54   Dg Abd 2 Views  06/11/2014   CLINICAL DATA:  Abdominal pain for 2 days.  EXAM: ABDOMEN - 2 VIEW  COMPARISON:  None available.  FINDINGS: Supine and upright views of the abdomen demonstrate a nonspecific bowel gas pattern. There is no evidence for obstruction or free air. Surgical clips are present at the gallbladder fossa. The lung bases are clear. The heart size is normal. The axial skeleton is unremarkable.  IMPRESSION: Negative two view abdomen.   Electronically Signed   By: Lawrence Santiago M.D.   On: 06/11/2014 17:17  Medications  0.9 %  sodium chloride infusion (0 mLs Intravenous Stopped 06/11/14 1916)    Followed by  0.9 %  sodium chloride infusion (1,000 mLs Intravenous New Bag/Given 06/11/14 1916)  iohexol (OMNIPAQUE) 300 MG/ML solution 25 mL (25 mLs Oral Contrast Given 06/11/14 1818)  ondansetron (ZOFRAN) injection 4 mg (4 mg Intravenous Given 06/11/14 1820)  HYDROmorphone (DILAUDID) injection 1 mg (1 mg Intravenous Given 06/11/14 2018)  iohexol (OMNIPAQUE) 300 MG/ML solution 100 mL (100 mLs Intravenous Contrast Given 06/11/14 1931)   2030  Pt is having peristent abdominal pain.  Very uncomfortable  MDM   Final diagnoses:  Ventral hernia without obstruction or gangrene    Pt is having severe persistent pain despite pain medications.  I will consult with general surgery.  She may require surgical intervention this evening.    Dorie Rank, MD 06/11/14 2041

## 2014-06-12 ENCOUNTER — Inpatient Hospital Stay (HOSPITAL_COMMUNITY): Payer: Self-pay

## 2014-06-12 LAB — CBC
HCT: 38.3 % (ref 36.0–46.0)
Hemoglobin: 12.3 g/dL (ref 12.0–15.0)
MCH: 30.9 pg (ref 26.0–34.0)
MCHC: 32.1 g/dL (ref 30.0–36.0)
MCV: 96.2 fL (ref 78.0–100.0)
Platelets: 276 10*3/uL (ref 150–400)
RBC: 3.98 MIL/uL (ref 3.87–5.11)
RDW: 14.3 % (ref 11.5–15.5)
WBC: 8.8 10*3/uL (ref 4.0–10.5)

## 2014-06-12 LAB — BASIC METABOLIC PANEL
Anion gap: 9 (ref 5–15)
BUN: 8 mg/dL (ref 6–23)
CALCIUM: 7.9 mg/dL — AB (ref 8.4–10.5)
CO2: 27 mEq/L (ref 19–32)
Chloride: 104 mEq/L (ref 96–112)
Creatinine, Ser: 0.67 mg/dL (ref 0.50–1.10)
GFR calc Af Amer: >90 mL/min (ref >90)
GLUCOSE: 84 mg/dL (ref 70–99)
Potassium: 4.4 mEq/L (ref 3.7–5.3)
Sodium: 140 mEq/L (ref 137–147)

## 2014-06-12 LAB — SURGICAL PCR SCREEN
MRSA, PCR: NEGATIVE
Staphylococcus aureus: NEGATIVE

## 2014-06-12 MED ORDER — SODIUM CHLORIDE 0.9 % IV BOLUS (SEPSIS)
500.0000 mL | Freq: Once | INTRAVENOUS | Status: AC
Start: 2014-06-12 — End: 2014-06-12
  Administered 2014-06-12: 500 mL via INTRAVENOUS

## 2014-06-12 MED ORDER — CEFAZOLIN SODIUM-DEXTROSE 2-3 GM-% IV SOLR: 2.0000 g | INTRAVENOUS | Status: DC

## 2014-06-12 MED ORDER — CEFAZOLIN SODIUM-DEXTROSE 2-3 GM-% IV SOLR
2.0000 g | INTRAVENOUS | Status: AC
  Administered 2014-06-13: 2 g via INTRAVENOUS
  Filled 2014-06-12: qty 50

## 2014-06-12 MED ORDER — DIPHENHYDRAMINE HCL 50 MG/ML IJ SOLN
25.0000 mg | Freq: Three times a day (TID) | INTRAMUSCULAR | Status: DC | PRN
  Administered 2014-06-12: 25 mg via INTRAVENOUS
  Filled 2014-06-12: qty 1

## 2014-06-12 NOTE — Progress Notes (Signed)
[facility] Progress Note     Subjective: Pt in a lot of pain, sitting up in bed.  No N/V, denies good flatus or BM since admission.  Ambulating some OOB.  Would like to alternate ice/heat packs on abdomen.    Objective: Vital signs in last 24 hours: Temp:  [98.1 F (36.7 C)-98.5 F (36.9 C)] 98.3 F (36.8 C) (10/30 0723) Pulse Rate:  [57-74] 63 (10/30 0723) Resp:  [10-25] 16 (10/30 0723) BP: (95-176)/(48-92) 97/59 mmHg (10/30 0723) SpO2:  [91 %-97 %] 91 % (10/30 0723) Weight:  [230 lb (104.327 kg)] 230 lb (104.327 kg) (10/29 1624)    Intake/Output from previous day:   Intake/Output this shift:    PE: Gen:  Alert, NAD, pleasant Abd: Obese, soft, distended, tender to right of umbilicus large palpable hernia, +BS, no HSM, abdominal scars noted   Lab Results:   Recent Labs  06/11/14 1636 06/12/14 0422  WBC 10.4 8.8  HGB 14.3 12.3  HCT 43.6 38.3  PLT 310 276   BMET  Recent Labs  06/11/14 1636 06/12/14 0422  NA 140 140  K 4.6 4.4  CL 102 104  CO2 27 27  GLUCOSE 88 84  BUN 9 8  CREATININE 0.65 0.67  CALCIUM 9.2 7.9*   PT/INR No results found for this basename: LABPROT, INR,  in the last 72 hours CMP     Component Value Date/Time   NA 140 06/12/2014 0422   K 4.4 06/12/2014 0422   CL 104 06/12/2014 0422   CO2 27 06/12/2014 0422   GLUCOSE 84 06/12/2014 0422   BUN 8 06/12/2014 0422   CREATININE 0.67 06/12/2014 0422   CALCIUM 7.9* 06/12/2014 0422   PROT 7.1 06/11/2014 1636   ALBUMIN 3.5 06/11/2014 1636   AST 41* 06/11/2014 1636   ALT 51* 06/11/2014 1636   ALKPHOS 63 06/11/2014 1636   BILITOT 0.3 06/11/2014 1636   GFRNONAA >90 06/12/2014 0422   GFRAA >90 06/12/2014 0422   Lipase     Component Value Date/Time   LIPASE 50 06/11/2014 1636       Studies/Results: Dg Chest 2 View  06/12/2014   CLINICAL DATA:  Preoperative hernia repair.  Hypertension.  EXAM: CHEST  2 VIEW  COMPARISON:  None.  FINDINGS: There is mild elevation of the  left hemidiaphragm. There is atelectatic change in the left base. Elsewhere lungs are clear. Heart size and pulmonary vascularity are normal. No adenopathy. No bone lesions.  IMPRESSION: Atelectatic change left base.  Elsewhere lungs are clear.   Electronically Signed   By: Lowella Grip M.D.   On: 06/12/2014 08:06   Ct Abdomen Pelvis W Contrast  06/11/2014   CLINICAL DATA:  Right-sided abdominal pain and distention with nausea, vomiting, and diarrhea  EXAM: CT ABDOMEN AND PELVIS WITH CONTRAST  TECHNIQUE: Multidetector CT imaging of the abdomen and pelvis was performed using the standard protocol following bolus administration of intravenous contrast. Oral contrast was also administered.  CONTRAST:  141mL OMNIPAQUE IOHEXOL 300 MG/ML  SOLN  COMPARISON:  None.  FINDINGS: There is a foramen of Magendie hernia on the left anteriorly. The stomach extends into this hernia. There is atelectatic change in the left lung base. Lung bases are otherwise clear.  The liver is enlarged measuring 19.8 cm in length. There is hepatic steatosis throughout much of the liver. Beyond the hepatic steatosis, no focal liver lesions are identified. Gallbladder is absent. There is no appreciable biliary duct dilatation.  Spleen, pancreas, and  adrenals appear normal. Kidneys bilaterally show no appreciable mass or hydronephrosis on either side. There is no renal or ureteral calculus on either side.  There is a moderate ventral hernia containing fat and surgical clips but no bowel.  In the pelvis, the urinary bladder is midline with normal wall thickness. There is no pelvic mass or pelvic fluid collection. Appendix appears normal. Terminal ileum appears normal.  There is no bowel obstruction. No free air or portal venous air. There is no appreciable ascites, adenopathy, or abscess in the abdomen or pelvis. There is no demonstrable abdominal aortic aneurysm. There is postoperative change in the left femur proximally. There are no blastic  or lytic bone lesions.  IMPRESSION: There is a left-sided foramen of Magendie hernia anteriorly with the stomach extending into this hernia. There is no gastric compromise. There is left lung base atelectasis in this region.  Enlarged liver with hepatic steatosis.  Gallbladder absent.  No bowel obstruction. No abscess. Appendix appears normal. No renal or ureteral calculus. No hydronephrosis.  There is a ventral hernia containing only fat and surgical clips.   Electronically Signed   By: Lowella Grip M.D.   On: 06/11/2014 19:54   Dg Abd 2 Views  06/11/2014   CLINICAL DATA:  Abdominal pain for 2 days.  EXAM: ABDOMEN - 2 VIEW  COMPARISON:  None available.  FINDINGS: Supine and upright views of the abdomen demonstrate a nonspecific bowel gas pattern. There is no evidence for obstruction or free air. Surgical clips are present at the gallbladder fossa. The lung bases are clear. The heart size is normal. The axial skeleton is unremarkable.  IMPRESSION: Negative two view abdomen.   Electronically Signed   By: Lawrence Santiago M.D.   On: 06/11/2014 17:17    Anti-infectives: Anti-infectives   None       Assessment/Plan Incarcerated incisional hernia  History VTE/PE - on xarelto recently switched off life-long coumadin Paraesophageal hernia  Plan: 1. She has incarcerated incisional hernia with fat, no obstruction or concern for bowel compromise. With xarelto I think admission and repair in 48 hours from last dose would be best unless there is clinical change.  2. Will hold xarelto and plan surgery. If not on xarelto any longer than 48 hours will need to be bridged. Her hematologist is in another city ([place]). Can consult her if needed.  3. Hiatal hernia will need evaluation, but this is not source of current pain and can be addressed later in elective fashion to maximize her pulmonary issues and try to get her to stop smoking  4. Scds, protonix  5. Will continue home anti-HTN 6. Ativan PRN for  anxiety 7. Ice chips, npo after MN, pre-op abx, consent     LOS: 1 day    DORT, Bra[place] Millhouse 06/12/2014, 8:45 AM Pager: [phone]

## 2014-06-12 NOTE — Progress Notes (Signed)
The hernia is partially reducible.  Not very tender.  Will target tomorrow or Sunday for repair.  Kathryne Eriksson. Dahlia Bailiff, MD, [place] [phone] [facility]

## 2014-06-13 ENCOUNTER — Encounter (HOSPITAL_COMMUNITY): Payer: Self-pay | Admitting: Anesthesiology

## 2014-06-13 ENCOUNTER — Encounter (HOSPITAL_COMMUNITY): Payer: MEDICAID | Admitting: Anesthesiology

## 2014-06-13 ENCOUNTER — Encounter (HOSPITAL_COMMUNITY): Admission: EM | Disposition: A | Discharge status: Home / Self Care | Payer: Self-pay | Source: Home / Self Care

## 2014-06-13 ENCOUNTER — Inpatient Hospital Stay (HOSPITAL_COMMUNITY): Payer: Self-pay | Admitting: Anesthesiology

## 2014-06-13 HISTORY — PX: VENTRAL HERNIA REPAIR: SHX424

## 2014-06-13 SURGERY — REPAIR, HERNIA, VENTRAL
Anesthesia: General

## 2014-06-13 MED ORDER — PROMETHAZINE HCL 25 MG/ML IJ SOLN
INTRAMUSCULAR | Status: AC
  Administered 2014-06-13: 6.25 mg via INTRAVENOUS
  Filled 2014-06-13: qty 1

## 2014-06-13 MED ORDER — GLYCOPYRROLATE 0.2 MG/ML IJ SOLN
INTRAMUSCULAR | Status: AC
  Filled 2014-06-13: qty 3

## 2014-06-13 MED ORDER — SODIUM CHLORIDE 0.9 % IV SOLN
INTRAVENOUS | Status: DC
  Administered 2014-06-13: 15:00:00 via INTRAVENOUS

## 2014-06-13 MED ORDER — LIDOCAINE HCL (CARDIAC) 20 MG/ML IV SOLN
INTRAVENOUS | Status: AC
  Filled 2014-06-13: qty 5

## 2014-06-13 MED ORDER — SCOPOLAMINE 1 MG/3DAYS TD PT72
MEDICATED_PATCH | TRANSDERMAL | Status: DC | PRN
  Administered 2014-06-13: 1 via TRANSDERMAL

## 2014-06-13 MED ORDER — OXYCODONE HCL 5 MG PO TABS: 5.0000 mg | ORAL_TABLET | Freq: Once | ORAL | Status: DC | PRN

## 2014-06-13 MED ORDER — NEOSTIGMINE METHYLSULFATE 10 MG/10ML IV SOLN
INTRAVENOUS | Status: AC
  Filled 2014-06-13: qty 1

## 2014-06-13 MED ORDER — OXYCODONE HCL 5 MG/5ML PO SOLN: 5.0000 mg | Freq: Once | ORAL | Status: DC | PRN

## 2014-06-13 MED ORDER — RIVAROXABAN 20 MG PO TABS
20.0000 mg | ORAL_TABLET | Freq: Every day | ORAL | Status: DC
  Filled 2014-06-13: qty 1

## 2014-06-13 MED ORDER — ONDANSETRON HCL 4 MG/2ML IJ SOLN
INTRAMUSCULAR | Status: AC
  Filled 2014-06-13: qty 2

## 2014-06-13 MED ORDER — SODIUM CHLORIDE 0.9 % IR SOLN: Status: DC | PRN

## 2014-06-13 MED ORDER — FENTANYL CITRATE 0.05 MG/ML IJ SOLN
INTRAMUSCULAR | Status: AC
  Filled 2014-06-13: qty 5

## 2014-06-13 MED ORDER — CEFAZOLIN SODIUM 1-5 GM-% IV SOLN
INTRAVENOUS | Status: DC | PRN
  Administered 2014-06-13: 1 g via INTRAVENOUS

## 2014-06-13 MED ORDER — HYDROMORPHONE HCL 1 MG/ML IJ SOLN
INTRAMUSCULAR | Status: AC
  Administered 2014-06-13: 0.25 mg via INTRAVENOUS
  Filled 2014-06-13: qty 1

## 2014-06-13 MED ORDER — PROMETHAZINE HCL 25 MG/ML IJ SOLN
6.2500 mg | INTRAMUSCULAR | Status: DC | PRN
  Administered 2014-06-13: 6.25 mg via INTRAVENOUS

## 2014-06-13 MED ORDER — BUPIVACAINE-EPINEPHRINE (PF) 0.25% -1:200000 IJ SOLN
INTRAMUSCULAR | Status: AC
  Filled 2014-06-13: qty 30

## 2014-06-13 MED ORDER — MIDAZOLAM HCL 5 MG/5ML IJ SOLN
INTRAMUSCULAR | Status: DC | PRN
  Administered 2014-06-13: 2 mg via INTRAVENOUS

## 2014-06-13 MED ORDER — NEOSTIGMINE METHYLSULFATE 10 MG/10ML IV SOLN
INTRAVENOUS | Status: DC | PRN
  Administered 2014-06-13: 3 mg via INTRAVENOUS

## 2014-06-13 MED ORDER — PROPOFOL 10 MG/ML IV BOLUS
INTRAVENOUS | Status: AC
  Filled 2014-06-13: qty 20

## 2014-06-13 MED ORDER — CEFAZOLIN SODIUM-DEXTROSE 2-3 GM-% IV SOLR
INTRAVENOUS | Status: AC
  Filled 2014-06-13: qty 50

## 2014-06-13 MED ORDER — ROCURONIUM BROMIDE 100 MG/10ML IV SOLN
INTRAVENOUS | Status: DC | PRN
  Administered 2014-06-13: 20 mg via INTRAVENOUS
  Administered 2014-06-13: 30 mg via INTRAVENOUS

## 2014-06-13 MED ORDER — HYDROMORPHONE HCL 1 MG/ML IJ SOLN: 1.0000 mg | INTRAMUSCULAR | Status: DC | PRN

## 2014-06-13 MED ORDER — HYDROMORPHONE HCL 1 MG/ML IJ SOLN
0.2500 mg | INTRAMUSCULAR | Status: DC | PRN
  Administered 2014-06-13: 0.25 mg via INTRAVENOUS
  Administered 2014-06-13: 0.5 mg via INTRAVENOUS
  Administered 2014-06-13: 0.25 mg via INTRAVENOUS
  Administered 2014-06-13: 0.5 mg via INTRAVENOUS

## 2014-06-13 MED ORDER — LACTATED RINGERS IV SOLN
INTRAVENOUS | Status: DC | PRN
  Administered 2014-06-13 (×2): via INTRAVENOUS

## 2014-06-13 MED ORDER — POVIDONE-IODINE 10 % EX OINT
TOPICAL_OINTMENT | CUTANEOUS | Status: DC | PRN
  Administered 2014-06-13: 1 via TOPICAL

## 2014-06-13 MED ORDER — FENTANYL CITRATE 0.05 MG/ML IJ SOLN
INTRAMUSCULAR | Status: DC | PRN
  Administered 2014-06-13 (×2): 50 ug via INTRAVENOUS

## 2014-06-13 MED ORDER — LIDOCAINE HCL (CARDIAC) 20 MG/ML IV SOLN
INTRAVENOUS | Status: DC | PRN
  Administered 2014-06-13: 60 mg via INTRAVENOUS

## 2014-06-13 MED ORDER — ROCURONIUM BROMIDE 50 MG/5ML IV SOLN
INTRAVENOUS | Status: AC
  Filled 2014-06-13: qty 1

## 2014-06-13 MED ORDER — EPHEDRINE SULFATE 50 MG/ML IJ SOLN
INTRAMUSCULAR | Status: DC | PRN
  Administered 2014-06-13: 5 mg via INTRAVENOUS
  Administered 2014-06-13: 10 mg via INTRAVENOUS

## 2014-06-13 MED ORDER — SUCCINYLCHOLINE CHLORIDE 20 MG/ML IJ SOLN
INTRAMUSCULAR | Status: AC
  Filled 2014-06-13: qty 1

## 2014-06-13 MED ORDER — POVIDONE-IODINE 10 % EX OINT
TOPICAL_OINTMENT | CUTANEOUS | Status: AC
  Filled 2014-06-13: qty 28.35

## 2014-06-13 MED ORDER — HYDROMORPHONE HCL 1 MG/ML IJ SOLN
1.0000 mg | INTRAMUSCULAR | Status: DC | PRN
  Administered 2014-06-13: 1.5 mg via INTRAVENOUS
  Administered 2014-06-13 – 2014-06-14 (×4): 1 mg via INTRAVENOUS
  Administered 2014-06-14: 1.5 mg via INTRAVENOUS
  Administered 2014-06-14: 1 mg via INTRAVENOUS
  Administered 2014-06-14: 1.5 mg via INTRAVENOUS
  Filled 2014-06-13 (×2): qty 2
  Filled 2014-06-13 (×5): qty 1
  Filled 2014-06-13: qty 2

## 2014-06-13 MED ORDER — SCOPOLAMINE 1 MG/3DAYS TD PT72
MEDICATED_PATCH | TRANSDERMAL | Status: AC
  Filled 2014-06-13: qty 1

## 2014-06-13 MED ORDER — KETOROLAC TROMETHAMINE 30 MG/ML IJ SOLN
15.0000 mg | Freq: Once | INTRAMUSCULAR | Status: AC | PRN
  Administered 2014-06-13: 30 mg via INTRAVENOUS

## 2014-06-13 MED ORDER — PROPOFOL 10 MG/ML IV BOLUS
INTRAVENOUS | Status: DC | PRN
  Administered 2014-06-13: 200 mg via INTRAVENOUS

## 2014-06-13 MED ORDER — GLYCOPYRROLATE 0.2 MG/ML IJ SOLN
INTRAMUSCULAR | Status: DC | PRN
  Administered 2014-06-13: 0.4 mg via INTRAVENOUS

## 2014-06-13 MED ORDER — DIAZEPAM 5 MG PO TABS
10.0000 mg | ORAL_TABLET | Freq: Four times a day (QID) | ORAL | Status: DC | PRN
  Administered 2014-06-13: 10 mg via ORAL
  Filled 2014-06-13: qty 2

## 2014-06-13 MED ORDER — SUCCINYLCHOLINE CHLORIDE 20 MG/ML IJ SOLN
INTRAMUSCULAR | Status: DC | PRN
  Administered 2014-06-13: 100 mg via INTRAVENOUS

## 2014-06-13 MED ORDER — MIDAZOLAM HCL 2 MG/2ML IJ SOLN
INTRAMUSCULAR | Status: AC
  Filled 2014-06-13: qty 2

## 2014-06-13 MED ORDER — KETOROLAC TROMETHAMINE 30 MG/ML IJ SOLN
INTRAMUSCULAR | Status: AC
  Filled 2014-06-13: qty 1

## 2014-06-13 MED ORDER — HYDROMORPHONE HCL 1 MG/ML IJ SOLN
INTRAMUSCULAR | Status: AC
  Administered 2014-06-13: 0.5 mg via INTRAVENOUS
  Filled 2014-06-13: qty 1

## 2014-06-13 MED ORDER — ONDANSETRON HCL 4 MG/2ML IJ SOLN
INTRAMUSCULAR | Status: DC | PRN
  Administered 2014-06-13: 4 mg via INTRAVENOUS

## 2014-06-13 MED ORDER — ARTIFICIAL TEARS OP OINT
TOPICAL_OINTMENT | OPHTHALMIC | Status: AC
  Filled 2014-06-13: qty 3.5

## 2014-06-13 MED ORDER — BUPIVACAINE HCL (PF) 0.25 % IJ SOLN
INTRAMUSCULAR | Status: DC | PRN
  Administered 2014-06-13: 8 mL

## 2014-06-13 MED ORDER — ALBUTEROL SULFATE HFA 108 (90 BASE) MCG/ACT IN AERS
INHALATION_SPRAY | RESPIRATORY_TRACT | Status: DC | PRN
  Administered 2014-06-13: 5 via RESPIRATORY_TRACT

## 2014-06-13 MED ORDER — DEXAMETHASONE SODIUM PHOSPHATE 4 MG/ML IJ SOLN
INTRAMUSCULAR | Status: AC
  Filled 2014-06-13: qty 2

## 2014-06-13 MED ORDER — DEXAMETHASONE SODIUM PHOSPHATE 4 MG/ML IJ SOLN
INTRAMUSCULAR | Status: DC | PRN
  Administered 2014-06-13: 8 mg via INTRAVENOUS

## 2014-06-13 SURGICAL SUPPLY — 45 items
BAG DECANTER FOR FLEXI CONT (MISCELLANEOUS) ×3 IMPLANT
BINDER ABD UNIV 12 45-62 (WOUND CARE) ×1 IMPLANT
BINDER ABDOMINAL 46IN 62IN (WOUND CARE) ×3
BLADE SURG ROTATE 9660 (MISCELLANEOUS) IMPLANT
CANISTER SUCTION 2500CC (MISCELLANEOUS) ×3 IMPLANT
CHLORAPREP W/TINT 26ML (MISCELLANEOUS) ×3 IMPLANT
COVER SURGICAL LIGHT HANDLE (MISCELLANEOUS) ×3 IMPLANT
DRAPE LAPAROSCOPIC ABDOMINAL (DRAPES) ×3 IMPLANT
DRAPE UTILITY 15X26 W/TAPE STR (DRAPE) IMPLANT
ELECT CAUTERY BLADE 6.4 (BLADE) ×3 IMPLANT
ELECT REM PT RETURN 9FT ADLT (ELECTROSURGICAL) ×3
ELECTRODE REM PT RTRN 9FT ADLT (ELECTROSURGICAL) ×1 IMPLANT
GAUZE SPONGE 4X4 12PLY STRL (GAUZE/BANDAGES/DRESSINGS) ×3 IMPLANT
GLOVE BIOGEL PI IND STRL 8 (GLOVE) ×1 IMPLANT
GLOVE BIOGEL PI INDICATOR 8 (GLOVE) ×2
GLOVE ECLIPSE 7.5 STRL STRAW (GLOVE) ×3 IMPLANT
GOWN STRL REUS W/ TWL LRG LVL3 (GOWN DISPOSABLE) ×3 IMPLANT
GOWN STRL REUS W/TWL LRG LVL3 (GOWN DISPOSABLE) ×6
KIT BASIN OR (CUSTOM PROCEDURE TRAY) ×3 IMPLANT
KIT ROOM TURNOVER OR (KITS) ×3 IMPLANT
NEEDLE HYPO 25GX1X1/2 BEV (NEEDLE) ×3 IMPLANT
NS IRRIG 1000ML POUR BTL (IV SOLUTION) ×3 IMPLANT
PACK GENERAL/GYN (CUSTOM PROCEDURE TRAY) ×3 IMPLANT
PAD ARMBOARD 7.5X6 YLW CONV (MISCELLANEOUS) ×3 IMPLANT
SPONGE GAUZE 4X4 12PLY STER LF (GAUZE/BANDAGES/DRESSINGS) ×3 IMPLANT
STAPLER VISISTAT 35W (STAPLE) ×3 IMPLANT
SUT ETHIBOND NAB CTX #1 30IN (SUTURE) IMPLANT
SUT MNCRL AB 4-0 PS2 18 (SUTURE) IMPLANT
SUT NOVA 1 T20/GS 25DT (SUTURE) ×9 IMPLANT
SUT PROLENE 1 CT (SUTURE) IMPLANT
SUT SILK 2 0 (SUTURE) ×2
SUT SILK 2-0 18XBRD TIE 12 (SUTURE) ×1 IMPLANT
SUT VIC AB 2-0 CT1 27 (SUTURE)
SUT VIC AB 2-0 CT1 TAPERPNT 27 (SUTURE) IMPLANT
SUT VIC AB 3-0 CT1 27 (SUTURE)
SUT VIC AB 3-0 CT1 TAPERPNT 27 (SUTURE) IMPLANT
SUT VIC AB 3-0 SH 27 (SUTURE) ×4
SUT VIC AB 3-0 SH 27X BRD (SUTURE) ×1 IMPLANT
SUT VIC AB 3-0 SH 27XBRD (SUTURE) ×1 IMPLANT
SUT VICRYL AB 3 0 TIES (SUTURE) IMPLANT
SYR CONTROL 10ML LL (SYRINGE) IMPLANT
TAPE CLOTH SURG 4X10 WHT LF (GAUZE/BANDAGES/DRESSINGS) ×3 IMPLANT
TOWEL OR 17X24 6PK STRL BLUE (TOWEL DISPOSABLE) ×3 IMPLANT
TOWEL OR 17X26 10 PK STRL BLUE (TOWEL DISPOSABLE) ×3 IMPLANT
TRAY FOLEY CATH 14FRSI W/METER (CATHETERS) ×3 IMPLANT

## 2014-06-13 NOTE — Op Note (Signed)
OPERATIVE REPORT  DATE OF OPERATION: 06/11/2014 - 06/13/2014  PATIENT:  Courtney [name]  43 y.o. female  PRE-OPERATIVE DIAGNOSIS:  ventral hernia   POST-OPERATIVE DIAGNOSIS:  ventral hernia  PROCEDURE:  Procedure(s): HERNIA REPAIR VENTRAL ADULT  SURGEON:  Surgeon(s): Doreen Salvage, MD  ASSISTANT: None  ANESTHESIA:   general  EBL: <50 ml  BLOOD ADMINISTERED: none  DRAINS: Foley  SPECIMEN:  No Specimen  COUNTS CORRECT:  YES  PROCEDURE DETAILS: The patient was taken to the operating room and placed on the table in the supine position. After an adequate general endotracheal anesthetic was administered she was prepped and draped in the usual sterile manner exposing the abdomen.  The patient was brought to the operating room because of an incarcerated ventral hernia. A proper timeout was performed identifying the patient and the procedure to be performed. We marked the area for the supraumbilical incision. #10 blade was used to make that incision down into the subcutaneous tissue. Electrocautery, blunt dissection, and Metzenbaum scissors are used to dissect out the hernia sac which was attacked in the umbilicus. The umbilicus was not injured in the process of dissection.  We were able to separate the hernia sac away from the underlying and surrounding tissue. Once this was done a flap was made circumferentially in the suprafascial area. The hernia itself measured approximately 12 cm in maximal length and approximately 4 cm wide. We repaired the hernia using interrupted #1 Novafil sutures. Approximately 12 such interrupted sutures were used. We then reinforced the interrupted repair using running back-and-forth sutures of #1 Novafil. We irrigated the wound with saline solution and closed.  The deep subcutaneous layer was reapproximated using interrupted 2-0 Vicryl sutures. The umbilicus was inverted back into the subcutaneous area using interrupted 20 and 3-0 Vicryl stitches attaching it at  its apex to the fascia. More superficial reapproximated of the subcuticular stitches down with interrupted 3-0 Vicryl sutures. The skin was closed using stainless steel staples.  All needle counts, sponge counts, and instrument counts were correct. An abdominal binder was used postoperatively.  PATIENT DISPOSITION:  PACU - hemodynamically stable.   Shanasia Ibrahim, JAY 10/31/201512:24 PM

## 2014-06-13 NOTE — Anesthesia Preprocedure Evaluation (Addendum)
Anesthesia Evaluation  Patient identified by MRN, date of birth, ID band Patient awake    Reviewed: Allergy & Precautions, H&P , NPO status , Patient's Chart, lab work & pertinent test results  Airway Mallampati: II  TM Distance: >3 FB Neck ROM: Full    Dental  (+) Teeth Intact, Dental Advisory Given   Pulmonary asthma , Current Smoker,          Cardiovascular hypertension, Pt. on medications     Neuro/Psych negative neurological ROS  negative psych ROS   GI/Hepatic negative GI ROS, Mildly elevated LFT's   Endo/Other  Morbid obesity  Renal/GU negative Renal ROS     Musculoskeletal negative musculoskeletal ROS (+)   Abdominal   Peds  Hematology  (+) Blood dyscrasia (Hx PE on xarelto), ,   Anesthesia Other Findings   Reproductive/Obstetrics negative OB ROS                            Anesthesia Physical Anesthesia Plan  ASA: III  Anesthesia Plan: General   Post-op Pain Management:    Induction: Intravenous  Airway Management Planned: Oral ETT  Additional Equipment:   Intra-op Plan:   Post-operative Plan: Extubation in OR  Informed Consent: I have reviewed the patients History and Physical, chart, labs and discussed the procedure including the risks, benefits and alternatives for the proposed anesthesia with the patient or authorized representative who has indicated his/her understanding and acceptance.   Dental advisory given  Plan Discussed with: CRNA and Surgeon  Anesthesia Plan Comments:         Anesthesia Quick Evaluation

## 2014-06-13 NOTE — Anesthesia Postprocedure Evaluation (Signed)
  Anesthesia Post-op Note  Patient: Courtney [name]  Procedure(s) Performed: Procedure(s): HERNIA REPAIR VENTRAL ADULT (N/A)  Patient Location: PACU  Anesthesia Type:General  Level of Consciousness: awake, alert  and oriented  Airway and Oxygen Therapy: Patient Spontanous Breathing  Post-op Pain: none  Post-op Assessment: Post-op Vital signs reviewed  Post-op Vital Signs: Reviewed  Last Vitals:  Filed Vitals:   06/13/14 1315  BP:   [name]: 75  Temp:   Resp:     Complications: No apparent anesthesia complications

## 2014-06-13 NOTE — Transfer of Care (Signed)
Immediate Anesthesia Transfer of Care Note  Patient: Courtney [name]  Procedure(s) Performed: Procedure(s): HERNIA REPAIR VENTRAL ADULT (N/A)  Patient Location: PACU  Anesthesia Type:General  Level of Consciousness: awake, alert , oriented and patient cooperative  Airway & Oxygen Therapy: Patient Spontanous Breathing and Patient connected to nasal cannula oxygen  Post-op Assessment: Report given to PACU RN and Post -op Vital signs reviewed and stable  Post vital signs: Reviewed and stable  Complications: No apparent anesthesia complications

## 2014-06-14 LAB — BASIC METABOLIC PANEL
ANION GAP: 8 (ref 5–15)
BUN: 8 mg/dL (ref 6–23)
CO2: 28 mEq/L (ref 19–32)
Calcium: 8.2 mg/dL — ABNORMAL LOW (ref 8.4–10.5)
Chloride: 103 mEq/L (ref 96–112)
Creatinine, Ser: 0.94 mg/dL (ref 0.50–1.10)
GFR, EST AFRICAN AMERICAN: 84 mL/min — AB (ref >90)
GFR, EST NON AFRICAN AMERICAN: 73 mL/min — AB (ref >90)
Glucose, Bld: 117 mg/dL — ABNORMAL HIGH (ref 70–99)
POTASSIUM: 4.3 meq/L (ref 3.7–5.3)
SODIUM: 139 meq/L (ref 137–147)

## 2014-06-14 LAB — CBC
HCT: 33.9 % — ABNORMAL LOW (ref 36.0–46.0)
Hemoglobin: 10.7 g/dL — ABNORMAL LOW (ref 12.0–15.0)
MCH: 30.7 pg (ref 26.0–34.0)
MCHC: 31.6 g/dL (ref 30.0–36.0)
MCV: 97.1 fL (ref 78.0–100.0)
PLATELETS: 245 10*3/uL (ref 150–400)
RBC: 3.49 MIL/uL — ABNORMAL LOW (ref 3.87–5.11)
RDW: 14.3 % (ref 11.5–15.5)
WBC: 12.1 10*3/uL — ABNORMAL HIGH (ref 4.0–10.5)

## 2014-06-14 MED ORDER — OXYCODONE-ACETAMINOPHEN 5-325 MG PO TABS: 1.0000 | ORAL_TABLET | ORAL | Status: DC | PRN

## 2014-06-14 MED ORDER — OXYCODONE-ACETAMINOPHEN 5-325 MG PO TABS
1.0000 | ORAL_TABLET | ORAL | Status: DC | PRN
  Administered 2014-06-14: 2 via ORAL
  Filled 2014-06-14: qty 2

## 2014-06-14 NOTE — Discharge Summary (Signed)
Physician Discharge Summary [facility]  Patient ID: Courtney [name] MRN: [id number] DOB/AGE: 01-07-1971 43 y.o.  Admit date: 06/11/2014 Discharge date: 06/14/2014  Admission Diagnoses:  Incarcerated ventral hernia  Discharge Diagnoses:  Active Problems:   Incarcerated incisional hernia   Discharged Condition: good  Hospital Course: patient admitted with symptomatic incarcerated VH.  To OR for primary repair on 10/31.  Post op course uncomplicated.  Prepared for discharge on POD#1.  Consults: None  Treatments: surgery: primary repair incarcerated VH  Discharge Exam: Blood pressure 100/48, pulse 65, temperature 97.9 F (36.6 C), temperature source Oral, resp. rate 18, height 5\' 1"  (1.549 m), weight 230 lb (104.327 kg), last menstrual period 05/28/2014, SpO2 99 %. HEENT - clear Neck - soft Chest - clear bilaterally Cor - RRR Abd - soft, obese; dressing dry and intact  Disposition: Home  Discharge Instructions    Diet - low sodium heart healthy    Complete by:  As directed      Discharge instructions    Complete by:  As directed   [facility] [place]  Always review your discharge instruction sheet given to you by the facility where your surgery was performed.  A  prescription for pain medication may be given to you upon discharge.  Take your pain medication as prescribed.  If narcotic pain medicine is not needed, then you may take acetaminophen (Tylenol) or ibuprofen (Advil) as needed.  Take your usually prescribed medications unless otherwise directed.  If you need a refill on your pain medication, please contact your pharmacy.  They will contact our office to request authorization. Prescriptions will not be filled after 5 pm daily or on weekends.  You should follow a light diet the first 24 hours after arrival home, such as soup and crackers or toast.  Be sure to include plenty of fluids daily.  Resume  your normal diet the day after surgery.  Most patients will experience some swelling and bruising around the surgical site.  Ice packs and reclining will help.  Swelling and bruising can take several days to resolve.   It is common to experience some constipation if taking pain medication after surgery.  Increasing fluid intake and taking a stool softener (such as Colace) will usually help or prevent this problem from occurring.  A mild laxative (Milk of Magnesia or Miralax) should be taken according to package directions if there are no bowel movements after 48 hours.  Unless discharge instructions indicate otherwise, you may remove your bandages 24-48 hours after surgery, and you may shower at that time.  You may have steri-strips (small skin tapes) in place directly over the incision.  These strips should be left on the skin for 7-10 days.  If your surgeon used skin glue on the incision, you may shower in 24 hours.  The glue will flake off over the next 2-3 weeks.  Any sutures or staples will be removed at the office during your follow-up visit.  ACTIVITIES:  You may resume regular (light) daily activities beginning the next day-such as daily self-care, walking, climbing stairs-gradually increasing activities as tolerated.  You may have sexual intercourse when it is comfortable.  Refrain from any heavy lifting or straining until approved by your doctor.  You may drive when you are no longer taking prescription pain medication, you can comfortably wear a seatbelt, and you can safely maneuver your car and apply brakes.  You should see your doctor  in the office for a follow-up appointment approximately 2-3 weeks after your surgery.  Make sure that you call for this appointment within a day or two after you arrive home to insure a convenient appointment time.   WHEN TO CALL YOUR DOCTOR: Fever greater than 101.0 Inability to urinate Persistent nausea and/or vomiting Extreme swelling or  bruising Continued bleeding from incision Increased pain, redness, or drainage from the incision  The clinic staff is available to answer your questions during regular business hours.  Please don't hesitate to call and ask to speak to one of the nurses for clinical concerns.  If you have a medical emergency, go to the nearest emergency room or call 911.  A surgeon from [facility] is always on call for the hospital.   [facility] [address] [place], [place], [place]  [address]  [phone] ? [phone] ? FAX [phone]  [email or web address]     Increase activity slowly    Complete by:  As directed      Remove dressing in 24 hours    Complete by:  As directed             Medication List    TAKE these medications        albuterol (2.5 MG/3ML) 0.083% nebulizer solution  Commonly known as:  PROVENTIL  Take 6 mLs (5 mg total) by nebulization once.     albuterol 108 (90 BASE) MCG/ACT inhaler  Commonly known as:  PROVENTIL HFA;VENTOLIN HFA  Inhale 2 puffs into the lungs every 6 (six) hours as needed for wheezing or shortness of breath.     diazepam 10 MG tablet  Commonly known as:  VALIUM  Take 10 mg by mouth every 6 (six) hours as needed for anxiety.     escitalopram 20 MG tablet  Commonly known as:  LEXAPRO  Take 20 mg by mouth daily.     lisinopril-hydrochlorothiazide 20-12.5 MG per tablet  Commonly known as:  PRINZIDE,ZESTORETIC  Take 1 tablet by mouth 2 (two) times daily.     oxyCODONE-acetaminophen 5-325 MG per tablet  Commonly known as:  PERCOCET/ROXICET  Take 1-2 tablets by mouth every 4 (four) hours as needed for moderate pain.     rivaroxaban 20 MG Tabs tablet  Commonly known as:  XARELTO  Take 1 tablet (20 mg total) by mouth daily with supper.           Follow-up Information    Follow up with WYATT, JAY, MD. Schedule an appointment as soon as possible for a visit in 1 week.   Specialty:   General Surgery   Why:  For wound re-check   Contact information:   [address], [place] [address] [phone]       Pearlina Friedly M. Braxton Weisbecker, MD, [facility] Office: [phone]   Signed: Earnstine Regal 06/14/2014, 9:04 AM

## 2014-06-14 NOTE — Progress Notes (Signed)
IV out. Voided. Ambulated in hallway. Discharge instructions and prescription given to patient with good teachback. No larger abdominal binder available. Discharged via wheelchair with NT present. Son accompanied patient. Melford Aase, RN

## 2014-06-15 ENCOUNTER — Encounter (HOSPITAL_COMMUNITY): Payer: Self-pay | Admitting: General Surgery

## 2014-06-24 ENCOUNTER — Ambulatory Visit (INDEPENDENT_AMBULATORY_CARE_PROVIDER_SITE_OTHER): Payer: BC Managed Care – PPO | Admitting: Psychiatry

## 2014-06-24 ENCOUNTER — Encounter (HOSPITAL_COMMUNITY): Payer: Self-pay | Admitting: Psychiatry

## 2014-06-24 ENCOUNTER — Encounter (INDEPENDENT_AMBULATORY_CARE_PROVIDER_SITE_OTHER): Payer: Self-pay

## 2014-06-24 VITALS — BP 138/91 | HR 79 | Ht 61.0 in | Wt 229.4 lb

## 2014-06-24 DIAGNOSIS — F410 Panic disorder [episodic paroxysmal anxiety] without agoraphobia: Secondary | ICD-10-CM

## 2014-06-24 DIAGNOSIS — F331 Major depressive disorder, recurrent, moderate: Secondary | ICD-10-CM

## 2014-06-24 DIAGNOSIS — F3130 Bipolar disorder, current episode depressed, mild or moderate severity, unspecified: Secondary | ICD-10-CM

## 2014-06-24 DIAGNOSIS — F4310 Post-traumatic stress disorder, unspecified: Secondary | ICD-10-CM

## 2014-06-24 MED ORDER — DULOXETINE HCL 30 MG PO CPEP: ORAL_CAPSULE | ORAL | Status: DC

## 2014-06-24 MED ORDER — ZOLPIDEM TARTRATE 10 MG PO TABS: 10.0000 mg | ORAL_TABLET | Freq: Every evening | ORAL | Status: DC | PRN

## 2014-06-24 MED ORDER — HYDROXYZINE PAMOATE 25 MG PO CAPS: 25.0000 mg | ORAL_CAPSULE | ORAL | Status: DC | PRN
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
## 2014-06-25 ENCOUNTER — Telehealth (HOSPITAL_COMMUNITY): Payer: Self-pay | Admitting: *Deleted

## 2014-06-25 DIAGNOSIS — F331 Major depressive disorder, recurrent, moderate: Secondary | ICD-10-CM

## 2014-06-25 MED ORDER — HYDROXYZINE PAMOATE 25 MG PO CAPS: 25.0000 mg | ORAL_CAPSULE | Freq: Every day | ORAL | Status: DC | PRN

## 2014-06-25 NOTE — Telephone Encounter (Signed)
Per MD, Vistaril 25 mg PRN daily if needed. Called pharmacy and gave this information

## 2014-06-29 ENCOUNTER — Encounter (HOSPITAL_COMMUNITY): Payer: Self-pay | Admitting: Emergency Medicine

## 2014-06-29 ENCOUNTER — Observation Stay (HOSPITAL_COMMUNITY): Payer: BC Managed Care – PPO

## 2014-06-29 ENCOUNTER — Emergency Department (HOSPITAL_COMMUNITY): Payer: BC Managed Care – PPO

## 2014-06-29 ENCOUNTER — Inpatient Hospital Stay (HOSPITAL_COMMUNITY)
Admission: EM | Admit: 2014-06-29 | Discharge: 2014-07-01 | DRG: 921 | Disposition: A | Discharge status: Home / Self Care | Payer: BC Managed Care – PPO | Attending: Surgery | Admitting: Surgery

## 2014-06-29 DIAGNOSIS — R109 Unspecified abdominal pain: Secondary | ICD-10-CM | POA: Diagnosis present

## 2014-06-29 DIAGNOSIS — Y838 Other surgical procedures as the cause of abnormal reaction of the patient, or of later complication, without mention of misadventure at the time of the procedure: Secondary | ICD-10-CM | POA: Diagnosis present

## 2014-06-29 DIAGNOSIS — L7622 Postprocedural hemorrhage and hematoma of skin and subcutaneous tissue following other procedure: Secondary | ICD-10-CM | POA: Diagnosis not present

## 2014-06-29 DIAGNOSIS — Z7901 Long term (current) use of anticoagulants: Secondary | ICD-10-CM

## 2014-06-29 DIAGNOSIS — IMO0002 Reserved for concepts with insufficient information to code with codable children: Secondary | ICD-10-CM

## 2014-06-29 DIAGNOSIS — Z881 Allergy status to other antibiotic agents status: Secondary | ICD-10-CM

## 2014-06-29 DIAGNOSIS — I10 Essential (primary) hypertension: Secondary | ICD-10-CM | POA: Diagnosis present

## 2014-06-29 DIAGNOSIS — Z86711 Personal history of pulmonary embolism: Secondary | ICD-10-CM

## 2014-06-29 DIAGNOSIS — F17210 Nicotine dependence, cigarettes, uncomplicated: Secondary | ICD-10-CM | POA: Diagnosis present

## 2014-06-29 DIAGNOSIS — Z79899 Other long term (current) drug therapy: Secondary | ICD-10-CM

## 2014-06-29 DIAGNOSIS — J45909 Unspecified asthma, uncomplicated: Secondary | ICD-10-CM | POA: Diagnosis present

## 2014-06-29 HISTORY — DX: Anxiety disorder, unspecified: F41.9

## 2014-06-29 HISTORY — DX: Major depressive disorder, single episode, unspecified: F32.9

## 2014-06-29 HISTORY — DX: Depression, unspecified: F32.A

## 2014-06-29 LAB — CBC WITH DIFFERENTIAL/PLATELET
Basophils Absolute: 0.1 10*3/uL (ref 0.0–0.1)
Basophils Relative: 1 % (ref 0–1)
Eosinophils Absolute: 0.3 10*3/uL (ref 0.0–0.7)
Eosinophils Relative: 3 % (ref 0–5)
HCT: 41.4 % (ref 36.0–46.0)
HEMOGLOBIN: 13.5 g/dL (ref 12.0–15.0)
Lymphocytes Relative: 29 % (ref 12–46)
Lymphs Abs: 3.8 10*3/uL (ref 0.7–4.0)
MCH: 30.8 pg (ref 26.0–34.0)
MCHC: 32.6 g/dL (ref 30.0–36.0)
MCV: 94.5 fL (ref 78.0–100.0)
Monocytes Absolute: 0.5 10*3/uL (ref 0.1–1.0)
Monocytes Relative: 4 % (ref 3–12)
NEUTROS PCT: 63 % (ref 43–77)
Neutro Abs: 8.2 10*3/uL — ABNORMAL HIGH (ref 1.7–7.7)
PLATELETS: 500 10*3/uL — AB (ref 150–400)
RBC: 4.38 MIL/uL (ref 3.87–5.11)
RDW: 13.6 % (ref 11.5–15.5)
WBC: 13 10*3/uL — AB (ref 4.0–10.5)

## 2014-06-29 LAB — URINALYSIS, ROUTINE W REFLEX MICROSCOPIC
Bilirubin Urine: NEGATIVE
GLUCOSE, UA: NEGATIVE mg/dL
HGB URINE DIPSTICK: NEGATIVE
KETONES UR: NEGATIVE mg/dL
Nitrite: NEGATIVE
PROTEIN: NEGATIVE mg/dL
Specific Gravity, Urine: 1.022 (ref 1.005–1.030)
Urobilinogen, UA: 0.2 mg/dL (ref 0.0–1.0)
pH: 5.5 (ref 5.0–8.0)

## 2014-06-29 LAB — COMPREHENSIVE METABOLIC PANEL
ALK PHOS: 61 U/L (ref 39–117)
ALT: 49 U/L — AB (ref 0–35)
AST: 41 U/L — ABNORMAL HIGH (ref 0–37)
Albumin: 3.3 g/dL — ABNORMAL LOW (ref 3.5–5.2)
Anion gap: 14 (ref 5–15)
BILIRUBIN TOTAL: 0.2 mg/dL — AB (ref 0.3–1.2)
BUN: 13 mg/dL (ref 6–23)
CHLORIDE: 101 meq/L (ref 96–112)
CO2: 25 mEq/L (ref 19–32)
Calcium: 9.2 mg/dL (ref 8.4–10.5)
Creatinine, Ser: 0.55 mg/dL (ref 0.50–1.10)
GLUCOSE: 105 mg/dL — AB (ref 70–99)
POTASSIUM: 4.3 meq/L (ref 3.7–5.3)
SODIUM: 140 meq/L (ref 137–147)
Total Protein: 6.9 g/dL (ref 6.0–8.3)

## 2014-06-29 LAB — URINE MICROSCOPIC-ADD ON

## 2014-06-29 LAB — LIPASE, BLOOD: Lipase: 59 U/L (ref 11–59)

## 2014-06-29 LAB — PREGNANCY, URINE: PREG TEST UR: NEGATIVE

## 2014-06-29 MED ORDER — ACETAMINOPHEN 650 MG RE SUPP: 650.0000 mg | Freq: Four times a day (QID) | RECTAL | Status: DC | PRN

## 2014-06-29 MED ORDER — DIPHENHYDRAMINE HCL 50 MG/ML IJ SOLN: 12.5000 mg | Freq: Four times a day (QID) | INTRAMUSCULAR | Status: DC | PRN

## 2014-06-29 MED ORDER — ONDANSETRON HCL 4 MG/2ML IJ SOLN
4.0000 mg | Freq: Once | INTRAMUSCULAR | Status: AC
  Administered 2014-06-29: 4 mg via INTRAVENOUS
  Filled 2014-06-29: qty 2

## 2014-06-29 MED ORDER — HYDROMORPHONE HCL 1 MG/ML IJ SOLN
0.5000 mg | INTRAMUSCULAR | Status: DC | PRN
  Administered 2014-06-29 – 2014-07-01 (×16): 1 mg via INTRAVENOUS
  Filled 2014-06-29 (×18): qty 1

## 2014-06-29 MED ORDER — ONDANSETRON HCL 4 MG/2ML IJ SOLN: 4.0000 mg | INTRAMUSCULAR | Status: DC | PRN

## 2014-06-29 MED ORDER — MORPHINE SULFATE 4 MG/ML IJ SOLN
4.0000 mg | Freq: Once | INTRAMUSCULAR | Status: AC
  Administered 2014-06-29: 4 mg via INTRAVENOUS
  Filled 2014-06-29: qty 1

## 2014-06-29 MED ORDER — HYDROCHLOROTHIAZIDE 12.5 MG PO CAPS
12.5000 mg | ORAL_CAPSULE | Freq: Two times a day (BID) | ORAL | Status: DC
  Administered 2014-06-29 – 2014-07-01 (×5): 12.5 mg via ORAL
  Filled 2014-06-29 (×8): qty 1

## 2014-06-29 MED ORDER — SODIUM CHLORIDE 0.9 % IV SOLN: 25.0000 mg | INTRAVENOUS | Status: DC | PRN

## 2014-06-29 MED ORDER — PANTOPRAZOLE SODIUM 40 MG PO TBEC
40.0000 mg | DELAYED_RELEASE_TABLET | Freq: Every day | ORAL | Status: DC
  Administered 2014-06-29 – 2014-07-01 (×3): 40 mg via ORAL
  Filled 2014-06-29 (×3): qty 1

## 2014-06-29 MED ORDER — HYDROMORPHONE HCL 1 MG/ML IJ SOLN
1.0000 mg | Freq: Once | INTRAMUSCULAR | Status: AC
  Administered 2014-06-29: 1 mg via INTRAVENOUS
  Filled 2014-06-29: qty 1

## 2014-06-29 MED ORDER — POTASSIUM CHLORIDE IN NACL 20-0.9 MEQ/L-% IV SOLN
INTRAVENOUS | Status: DC
  Administered 2014-06-29 – 2014-07-01 (×5): via INTRAVENOUS
  Filled 2014-06-29 (×7): qty 1000

## 2014-06-29 MED ORDER — LISINOPRIL 20 MG PO TABS
20.0000 mg | ORAL_TABLET | Freq: Two times a day (BID) | ORAL | Status: DC
  Administered 2014-06-29 – 2014-07-01 (×5): 20 mg via ORAL
  Filled 2014-06-29 (×7): qty 1

## 2014-06-29 MED ORDER — IOHEXOL 300 MG/ML  SOLN
100.0000 mL | Freq: Once | INTRAMUSCULAR | Status: AC | PRN
  Administered 2014-06-29: 100 mL via INTRAVENOUS

## 2014-06-29 MED ORDER — ALBUTEROL SULFATE (2.5 MG/3ML) 0.083% IN NEBU
2.5000 mg | INHALATION_SOLUTION | Freq: Four times a day (QID) | RESPIRATORY_TRACT | Status: DC | PRN
  Administered 2014-06-30: 2.5 mg via RESPIRATORY_TRACT
  Filled 2014-06-29: qty 3

## 2014-06-29 MED ORDER — ZOLPIDEM TARTRATE 5 MG PO TABS: 5.0000 mg | ORAL_TABLET | Freq: Every evening | ORAL | Status: DC | PRN

## 2014-06-29 MED ORDER — ONDANSETRON HCL 4 MG/2ML IJ SOLN
4.0000 mg | Freq: Four times a day (QID) | INTRAMUSCULAR | Status: DC | PRN
  Administered 2014-06-29: 4 mg via INTRAVENOUS
  Filled 2014-06-29: qty 2

## 2014-06-29 MED ORDER — ZOLPIDEM TARTRATE 5 MG PO TABS: 10.0000 mg | ORAL_TABLET | Freq: Every evening | ORAL | Status: DC | PRN

## 2014-06-29 MED ORDER — IOHEXOL 300 MG/ML  SOLN
25.0000 mL | Freq: Once | INTRAMUSCULAR | Status: AC | PRN
  Administered 2014-06-29: 25 mL via ORAL

## 2014-06-29 MED ORDER — ACETAMINOPHEN 325 MG PO TABS: 650.0000 mg | ORAL_TABLET | Freq: Four times a day (QID) | ORAL | Status: DC | PRN

## 2014-06-29 MED ORDER — PROMETHAZINE HCL 25 MG/ML IJ SOLN
25.0000 mg | INTRAMUSCULAR | Status: DC | PRN
  Administered 2014-06-29: 25 mg via INTRAVENOUS
  Filled 2014-06-29 (×2): qty 1

## 2014-06-29 MED ORDER — ESCITALOPRAM OXALATE 10 MG PO TABS
20.0000 mg | ORAL_TABLET | Freq: Every day | ORAL | Status: DC
  Administered 2014-06-29 – 2014-07-01 (×3): 20 mg via ORAL
  Filled 2014-06-29 (×5): qty 2

## 2014-06-29 MED ORDER — LIDOCAINE HCL (CARDIAC) 20 MG/ML IV SOLN
INTRAVENOUS | Status: AC
  Administered 2014-06-29: 14:00:00
  Filled 2014-06-29: qty 5

## 2014-06-29 MED ORDER — OXYCODONE HCL 5 MG PO TABS
5.0000 mg | ORAL_TABLET | ORAL | Status: DC | PRN
  Administered 2014-06-30 – 2014-07-01 (×3): 5 mg via ORAL
  Filled 2014-06-29 (×4): qty 1

## 2014-06-29 MED ORDER — DIPHENHYDRAMINE HCL 12.5 MG/5ML PO ELIX: 12.5000 mg | ORAL_SOLUTION | Freq: Four times a day (QID) | ORAL | Status: DC | PRN

## 2014-06-29 NOTE — H&P (Signed)
[facility] Surgery Admission Note  Courtney [name] [birth year]  [id number].    Requesting MD: Dr. Dorie Rank Chief Complaint/Reason for Consult: Post-op abdominal pain  HPI:  43 y/o white female with PMH PE on Xarelto, Asthma, and HTN presents to [facility] with worsening abdominal pain, distension since Friday 06/26/14.  She recently was admitted for an incarcerated ventral hernia which was repaired by Dr. Hulen Skains on 06/13/14 primarily without mesh.  Her post-op course was uneventful and she went home on POD #1.  She was discharged home and was doing fantastic until Friday.  Her staples were removed in the office last week by Dr. Richarda Blade nurse.  In addition to the pain and abdominal distension she notes bleeding from incision, nausea, dry heaving, diarrhea, and anorexia.  Pain is located over her horizontal periumbilical incision and feels like a burning pressure sensation.  She notes redness and a "bulge" over the incision like there was before surgery.  She says last night she must have rolled over on her right side while sleeping and she woke up to "a lot of blood" from her incisions on her clothes and on her sheets.  She denies any other precipitating/alleviating factors, no radiating pain.  She says she has had a chronic problem with abdominal distension, pain, anorexia, nausea, and diarrhea for the last year.  She relates it to food, 15-20 minutes after eating she feels excessively full and uncomfortable.  It makes her not want to eat and she has cut back significantly on her PO intake.  She's currently tolerating liquids well.  Has never seen a GI doctor secondary to just moving here and having a demanding stressful job.  H/o ulcer with GI bleeding in the past.  H/o lap chole.    ROS: All systems reviewed and otherwise negative except for as above  Family History  Problem Relation Age of Onset  . Bipolar disorder Mother   . Bipolar disorder Father   . Depression Brother     Past Medical  History  Diagnosis Date  . PE (pulmonary embolism)     xarelto  . Asthma   . Hypertension     Lisinopril    Past Surgical History  Procedure Laterality Date  . Ventral hernia repair N/A 06/13/2014    Procedure: HERNIA REPAIR VENTRAL ADULT;  Surgeon: Doreen Salvage, MD;  Location: [facility];  Service: General;  Laterality: N/A;    Social History:  reports that she has been smoking.  She does not have any smokeless tobacco history on file. She reports that she does not drink alcohol or use illicit drugs.  Allergies:  Allergies  Allergen Reactions  . Levaquin [Levofloxacin In D5w] Hives     (Not in a hospital admission)  Blood pressure 126/77, pulse 73, temperature 97.8 F (36.6 C), temperature source Oral, resp. rate 20, height 5' 1"  (1.549 m), weight 220 lb (99.791 kg), last menstrual period 06/14/2014, SpO2 97 %. Physical Exam: General: pleasant, but anxious, WD/WN white female who is laying in bed in NAD HEENT: head is normocephalic, atraumatic.  Sclera are noninjected.  PERRL.  Ears and nose without any masses or lesions.  Mouth is pink and moist Heart: regular, rate, and rhythm.  No obvious murmurs, gallops, or rubs noted.  Palpable pedal pulses bilaterally Lungs: Right lung with rhonchi throughout, left lung with decreased breath sounds at bases, no wheezing or ralls.  Respiratory effort non-labored.  Noted productive coughing. Abd: soft, tender throughout >umbilical incision site, distended, horizontal incision  at umbilicus is indurated/erythematous/hard.  I do not see any current drainage, but she does have bloody drainage on her hospital gown.  Pain, induration, and erythema is greater on the right side of her horizontal incision.  +BS,  hernias, or organomegaly.  Scar appeared well healed.  Staples were removed in the office. MS: all 4 extremities are symmetrical with no cyanosis, clubbing, or edema. Skin: warm and dry with no masses, lesions, or rashes Psych: A&Ox3 with  significantly anxious.   Results for orders placed or performed during the hospital encounter of 06/29/14 (from the past 48 hour(s))  CBC with Differential     Status: Abnormal   Collection Time: 06/29/14  7:57 AM  Result Value Ref Range   WBC 13.0 (H) 4.0 - 10.5 K/uL   RBC 4.38 3.87 - 5.11 MIL/uL   Hemoglobin 13.5 12.0 - 15.0 g/dL   HCT 41.4 36.0 - 46.0 %   MCV 94.5 78.0 - 100.0 fL   MCH 30.8 26.0 - 34.0 pg   MCHC 32.6 30.0 - 36.0 g/dL   RDW 13.6 11.5 - 15.5 %   Platelets 500 (H) 150 - 400 K/uL   Neutrophils Relative % 63 43 - 77 %   Neutro Abs 8.2 (H) 1.7 - 7.7 K/uL   Lymphocytes Relative 29 12 - 46 %   Lymphs Abs 3.8 0.7 - 4.0 K/uL   Monocytes Relative 4 3 - 12 %   Monocytes Absolute 0.5 0.1 - 1.0 K/uL   Eosinophils Relative 3 0 - 5 %   Eosinophils Absolute 0.3 0.0 - 0.7 K/uL   Basophils Relative 1 0 - 1 %   Basophils Absolute 0.1 0.0 - 0.1 K/uL  Comprehensive metabolic panel     Status: Abnormal   Collection Time: 06/29/14  7:57 AM  Result Value Ref Range   Sodium 140 137 - 147 mEq/L   Potassium 4.3 3.7 - 5.3 mEq/L   Chloride 101 96 - 112 mEq/L   CO2 25 19 - 32 mEq/L   Glucose, Bld 105 (H) 70 - 99 mg/dL   BUN 13 6 - 23 mg/dL   Creatinine, Ser 0.55 0.50 - 1.10 mg/dL   Calcium 9.2 8.4 - 10.5 mg/dL   Total Protein 6.9 6.0 - 8.3 g/dL   Albumin 3.3 (L) 3.5 - 5.2 g/dL   AST 41 (H) 0 - 37 U/L   ALT 49 (H) 0 - 35 U/L   Alkaline Phosphatase 61 39 - 117 U/L   Total Bilirubin 0.2 (L) 0.3 - 1.2 mg/dL   GFR calc non Af Amer >90 >90 mL/min   GFR calc Af Amer >90 >90 mL/min    Comment: (NOTE) The eGFR has been calculated using the CKD EPI equation. This calculation has not been validated in all clinical situations. eGFR's persistently <90 mL/min signify possible Chronic Kidney Disease.    Anion gap 14 5 - 15  Lipase, blood     Status: None   Collection Time: 06/29/14  7:57 AM  Result Value Ref Range   Lipase 59 11 - 59 U/L  Urinalysis, Routine w reflex microscopic      Status: Abnormal   Collection Time: 06/29/14  8:31 AM  Result Value Ref Range   Color, Urine YELLOW YELLOW   APPearance CLOUDY (A) CLEAR   Specific Gravity, Urine 1.022 1.005 - 1.030   pH 5.5 5.0 - 8.0   Glucose, UA NEGATIVE NEGATIVE mg/dL   Hgb urine dipstick NEGATIVE NEGATIVE   Bilirubin Urine  NEGATIVE NEGATIVE   Ketones, ur NEGATIVE NEGATIVE mg/dL   Protein, ur NEGATIVE NEGATIVE mg/dL   Urobilinogen, UA 0.2 0.0 - 1.0 mg/dL   Nitrite NEGATIVE NEGATIVE   Leukocytes, UA SMALL (A) NEGATIVE  Urine microscopic-add on     Status: Abnormal   Collection Time: 06/29/14  8:31 AM  Result Value Ref Range   Squamous Epithelial / LPF MANY (A) RARE   WBC, UA 3-6 <3 WBC/hpf   Bacteria, UA FEW (A) RARE   Urine-Other TRICHOMONAS PRESENT    Dg Abd Acute W/chest  06/29/2014   CLINICAL DATA:  Abdominal pain.  EXAM: ACUTE ABDOMEN SERIES (ABDOMEN 2 VIEW & CHEST 1 VIEW)  COMPARISON:  June 11, 2014.  FINDINGS: There is no evidence of dilated bowel loops or free intraperitoneal air. No radiopaque calculi or other significant radiographic abnormality is seen. Heart size and mediastinal contours are within normal limits. Right lung is clear. Mild subsegmental left basilar atelectasis is noted. Stable elevated left hemidiaphragm. Status post cholecystectomy.  IMPRESSION: No evidence of bowel obstruction or ileus. Mild left basilar subsegmental atelectasis.   Electronically Signed   By: Sabino Dick M.D.   On: 06/29/2014 08:18      Assessment/Plan Abdominal pain/distension Chronic diarrhea Incisional fluid collection - seroma/hematoma Pulmonary embolism on Xarelto POD #16 s/p Ventral hernia repair without mesh S/P cholecystectomy Adventitious breath sounds/atelectasis Coughing Tarry stools   Plan: 1.  Admit to [facility] for observation, will obtain a CT scan of abdomen, C. Diff, and heme occult 2.  NPO, bowel rest, IVF, pain control, antiemetics, hold off on abx right now 3.  SCD's and hold DVT proph  and xarelto 4.  Ambulate and IS 5.  Has h/o asthma, start albuterol nebs 6.  Has a h/o cholecystectomy, may be contributing to diarrhea, will likely need to see GI at discharge to workup her chronic problems 7.  Start on protonix since the patient states she's having burning abdominal pain and has a h/o ulcer disease   DORT, Katherene Dinino, [facility] 06/29/2014, 10:47 AM Pager: [phone]

## 2014-06-29 NOTE — Discharge Planning (Signed)
  CARE MANAGEMENT ED NOTE 06/29/2014  Patient:  CHARDONAY, SCRITCHFIELD   Account Number:  [id number]  Date Initiated:  06/29/2014  Documentation initiated by:  [facility]  Subjective/Objective Assessment:   ABD PAIN//HOME WITH 44 YO SON, [place]     Subjective/Objective Assessment Detail:   ABD pain over weekend; feels like blaoting and pulsing on right side.//Pt just moved her from out of town...not quite on her feet.     Action/Plan:   Consult general surgery//CM consult for medication needs.   Action/Plan Detail:   Admit to [facility]; possible surgery//Pt stated she was concerned about affording Xarelto and Cymbalta.   Anticipated DC Date:       Status Recommendation to Physician:   Result of Recommendation:    Other ED Services  Consult [facility]  CM consult  Medication Assistance    Choice offered to / List presented to:            Status of service:  Completed, signed off

## 2014-06-29 NOTE — ED Notes (Signed)
Pt also taking xhereltto

## 2014-06-29 NOTE — ED Notes (Signed)
Pt reports that she had hernia surgery on oct. 31. Pt reports that 2 days ago she started having increased pain around the surgical site. Pt reports bloody drainage since last night. Pt called the on call surgeon and was told to return to the ED.

## 2014-06-29 NOTE — Procedures (Signed)
Incision and Drainage Procedure Note  Pre-operative Diagnosis: abdominal pain with seroma, s/p ventral hernia repair  Post-operative Diagnosis: same  Indications: This is a 43 yo female who was admitted due to abdominal pain who was found to have a seroma on a CT scan.  We proceeded with incision and drainage  Anesthesia: 1% plain lidocaine  Procedure Details  The procedure, risks and complications have been discussed in detail (including, but not limited to  infection, bleeding) with the patient, and the patient has given verbal consent to the procedure.  The skin was sterilely prepped and draped over the affected area in the usual fashion. After adequate local anesthesia, I&D with a #11 blade was performed on the midline incision to reopen it back up.  A Yankauer suction was used to aspirate 250cc of seroma.  After this was evacuated a kerlix that was moistened with saline was use to packed the wound.  The patient was observed until stable.  Findings: 250cc of seroma  EBL: minimal  Drains: none  Condition: Tolerated procedure well and Stable   Complications: none.  Osmara Drummonds E 2:50 PM 06/29/2014

## 2014-06-29 NOTE — ED Provider Notes (Signed)
CSN: [id number]     Arrival date & time 06/29/14  0547 History   First MD Initiated Contact with Patient 06/29/14 [phone]     Chief Complaint  Patient presents with  . Abdominal Pain    HPI Pt started having pain this past weekend.  She feels a bloating in her right side that feels like a pulsing and it hurts. She had surgery a couple of weeks ago for an incisional hernia repair.  She feels like something is causing the incision to push outward.  It is not as bad but she had diarrhea all weekend and felt sick all week.  She feels bloated.   She called the surgeon on call and was told to come to the ED.  She has had nausea but no vomiting.  No fever. Past Medical History  Diagnosis Date  . PE (pulmonary embolism)   . Asthma   . Hypertension    Past Surgical History  Procedure Laterality Date  . Ventral hernia repair N/A 06/13/2014    Procedure: HERNIA REPAIR VENTRAL ADULT;  Surgeon: Doreen Salvage, MD;  Location: [facility] OR;  Service: General;  Laterality: N/A;   Family History  Problem Relation Age of Onset  . Bipolar disorder Mother   . Bipolar disorder Father   . Depression Brother    History  Substance Use Topics  . Smoking status: Current Every Day Smoker  . Smokeless tobacco: Not on file  . Alcohol Use: No   OB History    No data available     Review of Systems  All other systems reviewed and are negative.     Allergies  Levaquin  Home Medications   Prior to Admission medications   Medication Sig Start Date End Date Taking? Authorizing Provider  albuterol (PROVENTIL HFA;VENTOLIN HFA) 108 (90 BASE) MCG/ACT inhaler Inhale 2 puffs into the lungs every 6 (six) hours as needed for wheezing or shortness of breath. 04/27/14  Yes Olam Idler, MD  escitalopram (LEXAPRO) 20 MG tablet Take 20 mg by mouth daily.   Yes Historical Provider, MD  lisinopril-hydrochlorothiazide (PRINZIDE,ZESTORETIC) 20-12.5 MG per tablet Take 1 tablet by mouth 2 (two) times daily.   Yes Historical  Provider, MD  rivaroxaban (XARELTO) 20 MG TABS tablet Take 1 tablet (20 mg total) by mouth daily with supper. 04/27/14  Yes Olam Idler, MD  zolpidem (AMBIEN) 10 MG tablet Take 1 tablet (10 mg total) by mouth at bedtime as needed for sleep. 06/24/14 07/24/14 Yes Kathlee Nations, MD  albuterol (PROVENTIL) (2.5 MG/3ML) 0.083% nebulizer solution Take 6 mLs (5 mg total) by nebulization once. Patient not taking: Reported on 06/29/2014 04/27/14   Olam Idler, MD  DULoxetine (CYMBALTA) 30 MG capsule Take 1 capsule daily for 1 week and than 2 daily Patient not taking: Reported on 06/29/2014 06/24/14   Kathlee Nations, MD  hydrOXYzine (VISTARIL) 25 MG capsule Take 1 capsule (25 mg total) by mouth daily as needed for anxiety. Patient not taking: Reported on 06/29/2014 06/24/14   Kathlee Nations, MD   BP 126/77 mmHg  Pulse 73  Temp(Src) 97.8 F (36.6 C) (Oral)  Resp 20  Ht 5\' 1"  (1.549 m)  Wt 220 lb (99.791 kg)  BMI 41.59 kg/m2  SpO2 97%  LMP 06/14/2014 Physical Exam  Constitutional: She appears well-developed and well-nourished. No distress.  HENT:  Head: Normocephalic and atraumatic.  Right Ear: External ear normal.  Left Ear: External ear normal.  Eyes: Conjunctivae are normal. Right  eye exhibits no discharge. Left eye exhibits no discharge. No scleral icterus.  Neck: Neck supple. No tracheal deviation present.  Cardiovascular: Normal rate, regular rhythm and intact distal pulses.   Pulmonary/Chest: Effort normal and breath sounds normal. No stridor. No respiratory distress. She has no wheezes. She has no rales.  Abdominal: Soft. Bowel sounds are normal. She exhibits no distension. There is tenderness. There is no rebound and no guarding.  Horizontal incision above the umbilicus, mild erythema, indurated, ttp with firmness primarily on the right side of the incision  Musculoskeletal: She exhibits no edema or tenderness.  Neurological: She is alert. She has normal strength. No cranial nerve  deficit (no facial droop, extraocular movements intact, no slurred speech) or sensory deficit. She exhibits normal muscle tone. She displays no seizure activity. Coordination normal.  Skin: Skin is warm and dry. No rash noted.  Psychiatric: She has a normal mood and affect.  Nursing note and vitals reviewed.   ED Course  Procedures (including critical care time) Labs Review Labs Reviewed  CBC WITH DIFFERENTIAL - Abnormal; Notable for the following:    WBC 13.0 (*)    Platelets 500 (*)    Neutro Abs 8.2 (*)    All other components within normal limits  COMPREHENSIVE METABOLIC PANEL - Abnormal; Notable for the following:    Glucose, Bld 105 (*)    Albumin 3.3 (*)    AST 41 (*)    ALT 49 (*)    Total Bilirubin 0.2 (*)    All other components within normal limits  URINALYSIS, ROUTINE W REFLEX MICROSCOPIC - Abnormal; Notable for the following:    APPearance CLOUDY (*)    Leukocytes, UA SMALL (*)    All other components within normal limits  URINE MICROSCOPIC-ADD ON - Abnormal; Notable for the following:    Squamous Epithelial / LPF MANY (*)    Bacteria, UA FEW (*)    All other components within normal limits  LIPASE, BLOOD  POC URINE PREG, ED    Imaging Review Dg Abd Acute W/chest  06/29/2014   CLINICAL DATA:  Abdominal pain.  EXAM: ACUTE ABDOMEN SERIES (ABDOMEN 2 VIEW & CHEST 1 VIEW)  COMPARISON:  June 11, 2014.  FINDINGS: There is no evidence of dilated bowel loops or free intraperitoneal air. No radiopaque calculi or other significant radiographic abnormality is seen. Heart size and mediastinal contours are within normal limits. Right lung is clear. Mild subsegmental left basilar atelectasis is noted. Stable elevated left hemidiaphragm. Status post cholecystectomy.  IMPRESSION: No evidence of bowel obstruction or ileus. Mild left basilar subsegmental atelectasis.   Electronically Signed   By: Sabino Dick M.D.   On: 06/29/2014 08:18     Medications  morphine 4 MG/ML  injection 4 mg (4 mg Intravenous Given 06/29/14 0750)  ondansetron (ZOFRAN) injection 4 mg (4 mg Intravenous Given 06/29/14 0750)  HYDROmorphone (DILAUDID) injection 1 mg (1 mg Intravenous Given 06/29/14 0852)    MDM   Final diagnoses:  Abdominal pain, acute    Pt is having postoperative pain in her incisional area associated with her recent surgery.  ?hematoma in the region vs recurrence hernia.   Will ct abdomen.  Case discussed with Dr Georgette Dover who will see patient in the ED.   Dorie Rank, MD 06/29/14 1004

## 2014-06-30 DIAGNOSIS — Z7901 Long term (current) use of anticoagulants: Secondary | ICD-10-CM | POA: Diagnosis not present

## 2014-06-30 DIAGNOSIS — I10 Essential (primary) hypertension: Secondary | ICD-10-CM | POA: Diagnosis present

## 2014-06-30 DIAGNOSIS — Z86711 Personal history of pulmonary embolism: Secondary | ICD-10-CM | POA: Diagnosis not present

## 2014-06-30 DIAGNOSIS — Z881 Allergy status to other antibiotic agents status: Secondary | ICD-10-CM | POA: Diagnosis not present

## 2014-06-30 DIAGNOSIS — J45909 Unspecified asthma, uncomplicated: Secondary | ICD-10-CM | POA: Diagnosis present

## 2014-06-30 DIAGNOSIS — Z79899 Other long term (current) drug therapy: Secondary | ICD-10-CM | POA: Diagnosis not present

## 2014-06-30 DIAGNOSIS — L7622 Postprocedural hemorrhage and hematoma of skin and subcutaneous tissue following other procedure: Secondary | ICD-10-CM | POA: Diagnosis present

## 2014-06-30 DIAGNOSIS — IMO0002 Reserved for concepts with insufficient information to code with codable children: Secondary | ICD-10-CM

## 2014-06-30 DIAGNOSIS — R109 Unspecified abdominal pain: Secondary | ICD-10-CM | POA: Diagnosis present

## 2014-06-30 DIAGNOSIS — F17210 Nicotine dependence, cigarettes, uncomplicated: Secondary | ICD-10-CM | POA: Diagnosis present

## 2014-06-30 DIAGNOSIS — Y838 Other surgical procedures as the cause of abnormal reaction of the patient, or of later complication, without mention of misadventure at the time of the procedure: Secondary | ICD-10-CM | POA: Diagnosis present

## 2014-06-30 LAB — BASIC METABOLIC PANEL
Anion gap: 12 (ref 5–15)
BUN: 8 mg/dL (ref 6–23)
CALCIUM: 8.6 mg/dL (ref 8.4–10.5)
CO2: 24 meq/L (ref 19–32)
Chloride: 101 mEq/L (ref 96–112)
Creatinine, Ser: 0.61 mg/dL (ref 0.50–1.10)
GFR calc Af Amer: >90 mL/min (ref >90)
GFR calc non Af Amer: >90 mL/min (ref >90)
GLUCOSE: 87 mg/dL (ref 70–99)
Potassium: 4.8 mEq/L (ref 3.7–5.3)
Sodium: 137 mEq/L (ref 137–147)

## 2014-06-30 LAB — CBC
HCT: 38.1 % (ref 36.0–46.0)
HEMOGLOBIN: 12 g/dL (ref 12.0–15.0)
MCH: 29.8 pg (ref 26.0–34.0)
MCHC: 31.5 g/dL (ref 30.0–36.0)
MCV: 94.5 fL (ref 78.0–100.0)
Platelets: 436 10*3/uL — ABNORMAL HIGH (ref 150–400)
RBC: 4.03 MIL/uL (ref 3.87–5.11)
RDW: 13.8 % (ref 11.5–15.5)
WBC: 10.5 10*3/uL (ref 4.0–10.5)

## 2014-06-30 MED ORDER — RIVAROXABAN 20 MG PO TABS
20.0000 mg | ORAL_TABLET | Freq: Every day | ORAL | Status: DC
  Administered 2014-06-30 – 2014-07-01 (×2): 20 mg via ORAL
  Filled 2014-06-30 (×2): qty 1

## 2014-06-30 MED ORDER — LIDOCAINE HCL 1 % IJ SOLN
10.0000 mL | INTRAMUSCULAR | Status: AC
  Administered 2014-06-30: 10 mL via INTRADERMAL
  Filled 2014-06-30: qty 10

## 2014-06-30 MED ORDER — CEFAZOLIN SODIUM 1-5 GM-% IV SOLN
1.0000 g | Freq: Three times a day (TID) | INTRAVENOUS | Status: DC
  Administered 2014-06-30 – 2014-07-01 (×3): 1 g via INTRAVENOUS
  Filled 2014-06-30 (×5): qty 50

## 2014-06-30 MED ORDER — LORAZEPAM 2 MG/ML IJ SOLN
0.5000 mg | INTRAMUSCULAR | Status: DC | PRN
  Administered 2014-06-30 – 2014-07-01 (×4): 0.5 mg via INTRAVENOUS
  Filled 2014-06-30 (×4): qty 1

## 2014-06-30 NOTE — Care Management (Signed)
VAC application completed and faxed to [facility]. Magdalen Spatz RN BSN

## 2014-06-30 NOTE — Progress Notes (Signed)
Subjective: Seroma drained yesterday - improved feeling of pressure, no more nausea, but still having pain at incision  Objective: Vital signs in last 24 hours: Temp:  [97.9 F (36.6 C)-98.4 F (36.9 C)] 98.4 F (36.9 C) (11/17 0927) Pulse Rate:  [61-79] 65 (11/17 0927) Resp:  [17-21] 20 (11/17 0927) BP: (102-133)/(42-74) 110/61 mmHg (11/17 0927) SpO2:  [94 %-96 %] 95 % (11/17 0927) Weight:  [220 lb (99.791 kg)] 220 lb (99.791 kg) (11/16 1158) Last BM Date: 06/28/14  Intake/Output from previous day: 11/16 0701 - 11/17 0700 In: 1392 [P.O.:480; I.V.:912] Out: 350 [Urine:350] Intake/Output this shift: Total I/O In: 240 [P.O.:240] Out: -   Abd - obese, soft, tender around incision Skin around incision shows no sign of erythema or infection Fascia appears to be intact Large undermining under skin flap  Lab Results:   Recent Labs  06/29/14 0757 06/30/14 0432  WBC 13.0* 10.5  HGB 13.5 12.0  HCT 41.4 38.1  PLT 500* 436*   BMET  Recent Labs  06/29/14 0757 06/30/14 0432  NA 140 137  K 4.3 4.8  CL 101 101  CO2 25 24  GLUCOSE 105* 87  BUN 13 8  CREATININE 0.55 0.61  CALCIUM 9.2 8.6   PT/INR No results for input(s): LABPROT, INR in the last 72 hours. ABG No results for input(s): PHART, HCO3 in the last 72 hours.  Invalid input(s): PCO2, PO2  Studies/Results: Ct Abdomen Pelvis W Contrast  06/29/2014   CLINICAL DATA:  Diffuse abdominal pain with bloody stools today. History of hernia repair 2 weeks ago. Initial encounter.  EXAM: CT ABDOMEN AND PELVIS WITH CONTRAST  TECHNIQUE: Multidetector CT imaging of the abdomen and pelvis was performed using the standard protocol following bolus administration of intravenous contrast.  CONTRAST:  148mL OMNIPAQUE IOHEXOL 300 MG/ML  SOLN  COMPARISON:  Acute abdominal series done today. Abdominal pelvic CT 06/11/2014.  FINDINGS: Lower chest: Again demonstrated is a large hiatal hernia. The majority of the stomach is herniated  into the lower left hemithorax. There is adjacent left lower lobe atelectasis. The right lung base is clear. There is no significant pleural or pericardial effusion.  Hepatobiliary: Mildly heterogeneous hepatic steatosis appear slightly improved. No focal liver lesions demonstrated. There is no significant biliary dilatation status post cholecystectomy.  Pancreas: Unremarkable. No pancreatic ductal dilatation or surrounding inflammatory changes.  Spleen: Normal in size without focal abnormality.  Adrenals/Urinary Tract: Both adrenal glands appear normal.The kidneys appear normal without evidence of urinary tract calculus or hydronephrosis. No bladder abnormalities are seen.  Stomach/Bowel: The proximal small bowel loops are mildly prominent in caliber, likely related to enteric contrast or a mild ileus. No evidence of bowel wall thickening, significant distention or surrounding inflammatory change.The appendix appears normal. There is no ascites or focal extraluminal fluid collection. No inflammatory changes are demonstrated.  Vascular/Lymphatic: Small mesenteric lymph nodes are not pathologically enlarged. No significant vascular findings are present.  Reproductive: The uterus and ovaries appear normal. There is no adnexal mass.  Other: There has been interval revision of the ventral hernia repair. Associated mesh appears intact. There is a fairly homogeneous fluid collection within the subcutaneous fat of the anterior abdominal wall which measures up to 11.1 x 6.1 cm transverse. No residual hernia is demonstrated.  Musculoskeletal: No acute or significant osseous findings. Previous left femoral ORIF noted.  IMPRESSION: 1. Interval repair of ventral hernia. There is a large fluid collection within the anterior abdominal wall, likely a postoperative seroma. No specific signs  of superimposed infection identified. Correlate clinically. 2. Stable large hiatal hernia with herniation of the majority of the stomach into  the lower left chest. 3. Possible minimal small bowel ileus. No evidence of obstruction or intra-abdominal inflammatory process.   Electronically Signed   By: Camie Patience M.D.   On: 06/29/2014 13:42   Dg Abd Acute W/chest  06/29/2014   CLINICAL DATA:  Abdominal pain.  EXAM: ACUTE ABDOMEN SERIES (ABDOMEN 2 VIEW & CHEST 1 VIEW)  COMPARISON:  June 11, 2014.  FINDINGS: There is no evidence of dilated bowel loops or free intraperitoneal air. No radiopaque calculi or other significant radiographic abnormality is seen. Heart size and mediastinal contours are within normal limits. Right lung is clear. Mild subsegmental left basilar atelectasis is noted. Stable elevated left hemidiaphragm. Status post cholecystectomy.  IMPRESSION: No evidence of bowel obstruction or ileus. Mild left basilar subsegmental atelectasis.   Electronically Signed   By: Sabino Dick M.D.   On: 06/29/2014 08:18    Anti-infectives: Anti-infectives    None      Assessment/Plan: s/p * No surgery found * s/p emergent repair of incarcerated ventral hernia, now with large subcutaneous seroma  Seroma has been evacuated, but dressing changes are an issue.  Recommend opening the wound completely and placing a VAC sponge into the wound.  This will aid with dressing changes and should speed up wound healing.   LOS: 1 day    Teka Chanda K. 06/30/2014

## 2014-06-30 NOTE — Care Management Note (Signed)
  Page 2 of 2   07/01/2014     10:54:28 AM CARE MANAGEMENT NOTE 07/01/2014  Patient:  AARIEL, EMS   Account Number:  [id number]  Date Initiated:  06/30/2014  Documentation initiated by:  Magdalen Spatz  Subjective/Objective Assessment:     Action/Plan:   Anticipated DC Date:     Anticipated DC Plan:  [place]         Choice offered to / List presented to:  C-1 Patient        [place] arranged  HH-1 RN      [place].   Status of service:  Completed, signed off Medicare Important Message given?   (If response is "NO", the following Medicare IM given date fields will be blank) Date Medicare IM given:   Medicare IM given by:   Date Additional Medicare IM given:   Additional Medicare IM given by:    Discharge Disposition:    Per UR Regulation:    If discussed at Long Length of Stay Meetings, dates discussed:    Comments:  07-01-14 VAC to be delivered to patient's room today . Magdalen Spatz RN BSN [phone]     06-30-14 Consult for assistance with Cymbalta . Gave patient [employer] application .  Patient has already used Xarelto 30 day free card . Provided patient with Xarelto assistance application also.  Patient states she does have insurance [employer] of [place] . Called [place] in admitting and faxed copy of front and back of insurance card to [place] at [phone].   Wound VAC application left in shadow chart for signature and measurements,  PA aware.   Confirmed face sheet information with patient.   Magdalen Spatz RN BSN [phone]

## 2014-06-30 NOTE — Progress Notes (Signed)
VAC applied to incision with Coralie Keens, PA. Measurements: 13cm (L) x 1.5cm (W) x 6cm (D)

## 2014-07-01 MED ORDER — OXYCODONE HCL 5 MG PO TABS: 5.0000 mg | ORAL_TABLET | ORAL | Status: DC | PRN

## 2014-07-01 MED ORDER — HYDROMORPHONE HCL 1 MG/ML IJ SOLN
0.5000 mg | INTRAMUSCULAR | Status: DC | PRN
  Administered 2014-07-01: 0.5 mg via INTRAVENOUS
  Filled 2014-07-01: qty 1

## 2014-07-01 MED ORDER — OXYCODONE HCL 5 MG PO TABS
5.0000 mg | ORAL_TABLET | ORAL | Status: DC | PRN
  Administered 2014-07-01 (×2): 10 mg via ORAL
  Filled 2014-07-01 (×2): qty 2

## 2014-07-01 MED ORDER — POLYETHYLENE GLYCOL 3350 17 G PO PACK
17.0000 g | PACK | Freq: Every day | ORAL | Status: DC
  Administered 2014-07-01: 17 g via ORAL
  Filled 2014-07-01: qty 1

## 2014-07-01 NOTE — Discharge Summary (Signed)
Patient ID: Courtney [name] MRN: [id number] DOB/AGE: 11/10/1970 43 y.o.  Admit date: 06/29/2014 Discharge date: 07/01/2014  Procedures: bedside incision and drainage of seroma Bedside opening of the wound with placement of abdominal VAC  Consults: None  Reason for Admission: 43 y/o white female with PMH PE on Xarelto, Asthma, and HTN presents to [facility] with worsening abdominal pain, distension since Friday 06/26/14. She recently was admitted for an incarcerated ventral hernia which was repaired by Dr. Hulen Skains on 06/13/14 primarily without mesh. Her post-op course was uneventful and she went home on POD #1. She was discharged home and was doing fantastic until Friday. Her staples were removed in the office last week by Dr. Richarda Blade nurse. In addition to the pain and abdominal distension she notes bleeding from incision, nausea, dry heaving, diarrhea, and anorexia. Pain is located over her horizontal periumbilical incision and feels like a burning pressure sensation. She notes redness and a "bulge" over the incision like there was before surgery. She says last night she must have rolled over on her right side while sleeping and she woke up to "a lot of blood" from her incisions on her clothes and on her sheets. She denies any other precipitating/alleviating factors, no radiating pain. She says she has had a chronic problem with abdominal distension, pain, anorexia, nausea, and diarrhea for the last year. She relates it to food, 15-20 minutes after eating she feels excessively full and uncomfortable. It makes her not want to eat and she has cut back significantly on her PO intake. She's currently tolerating liquids well. Has never seen a GI doctor secondary to just moving here and having a demanding stressful job. H/o ulcer with GI bleeding in the past. H/o lap chole.   Admission Diagnoses:  Abdominal pain/distension Chronic diarrhea Incisional fluid collection - seroma/hematoma Pulmonary  embolism on Xarelto POD #16 s/p Ventral hernia repair without mesh S/P cholecystectomy Adventitious breath sounds/atelectasis Coughing  Hospital Course: The patient was admitted and due to copious amounts of serous drainage a CT scan was completed to make sure her fascia was intact.  It appeared to be on scan, but she had a large seroma.  Her wound was then opened half way at the bedside and her seroma was drained.  She was initially put on cefoxitin due to a slightly elevated WBC, but this was stopped on HD 3 secondary to a normal WBC and fluid not being infected.  After this wound was opened, she continued to have some drainage and required dressing changes more often than ordered.  Therefore, the rest of her was opened and an abdominal VAC was placed to control the drainage.  The patient tolerated this.  The following day she was eager to go home.  Therefore, he VAC was changed to her home VAC and she was switched a M,W,F schedule.  She was then felt stable for dc home.  Discharge Diagnoses:  Active Problems:   Abdominal pain of unknown cause   Seroma POD 43, s/p ventral hernia repair with primary closure Chronic gastrointestinal complaints with bloating and intermittent diarrhea PE, on xarelto  Discharge Medications:   Medication List    TAKE these medications        albuterol (2.5 MG/3ML) 0.083% nebulizer solution  Commonly known as:  PROVENTIL  Take 6 mLs (5 mg total) by nebulization once.     albuterol 108 (90 BASE) MCG/ACT inhaler  Commonly known as:  PROVENTIL HFA;VENTOLIN HFA  Inhale 2 puffs into the lungs every 6 (  six) hours as needed for wheezing or shortness of breath.     DULoxetine 30 MG capsule  Commonly known as:  CYMBALTA  Take 1 capsule daily for 1 week and than 2 daily     escitalopram 20 MG tablet  Commonly known as:  LEXAPRO  Take 20 mg by mouth daily.     hydrOXYzine 25 MG capsule  Commonly known as:  VISTARIL  Take 1 capsule (25 mg total) by mouth daily  as needed for anxiety.     lisinopril-hydrochlorothiazide 20-12.5 MG per tablet  Commonly known as:  PRINZIDE,ZESTORETIC  Take 1 tablet by mouth 2 (two) times daily.     oxyCODONE 5 MG immediate release tablet  Commonly known as:  Oxy IR/ROXICODONE  Take 1-2 tablets (5-10 mg total) by mouth every 4 (four) hours as needed for moderate pain.     rivaroxaban 20 MG Tabs tablet  Commonly known as:  XARELTO  Take 1 tablet (20 mg total) by mouth daily with supper.     zolpidem 10 MG tablet  Commonly known as:  AMBIEN  Take 1 tablet (10 mg total) by mouth at bedtime as needed for sleep.        Discharge Instructions:     Follow-up Information    Follow up with Wenda Low, MD On 07/03/2014.   Specialty:  Internal Medicine   Why:  10:00am, arrive by 9:45am for paperwork   Contact information:   [address], [address] [place] [address] [phone]       Follow up with Doreen Salvage, MD. Schedule an appointment as soon as possible for a visit in 2 weeks.   Specialty:  General Surgery   Contact information:   [address] [place] [address] [phone]       Signed: Henreitta Cea 07/01/2014, 1:30 PM

## 2014-07-01 NOTE — Progress Notes (Signed)
Patient ID: Courtney [name], female   DOB: [birth year], 43 y.o.   MRN: [id number]    Subjective: Pt feels ok today, but still complains of GI problems that she has had for like 10 years.  Brother just diagnosed with crohns.  No BM yet.  Still with some pain, but only 2 weeks out from her prior operation.  Objective: Vital signs in last 24 hours: Temp:  [98.5 F (36.9 C)-98.6 F (37 C)] 98.5 F (36.9 C) (11/18 0621) Pulse Rate:  [75-97] 97 (11/18 0621) Resp:  [18-20] 18 (11/18 0621) BP: (92-129)/(51-79) 120/73 mmHg (11/18 0621) SpO2:  [94 %-97 %] 97 % (11/18 0621) Last BM Date: 06/28/14  Intake/Output from previous day: 11/17 0701 - 11/18 0700 In: 2543.3 [P.O.:240; I.V.:2303.3] Out: 50  Intake/Output this shift: Total I/O In: 480 [P.O.:480] Out: 300 [Urine:300]  PE: Abd: soft, minimally tender, +BS, wound VAC in place.  obese  Lab Results:   Recent Labs  06/29/14 0757 06/30/14 0432  WBC 13.0* 10.5  HGB 13.5 12.0  HCT 41.4 38.1  PLT 500* 436*   BMET  Recent Labs  06/29/14 0757 06/30/14 0432  NA 140 137  K 4.3 4.8  CL 101 101  CO2 25 24  GLUCOSE 105* 87  BUN 13 8  CREATININE 0.55 0.61  CALCIUM 9.2 8.6   PT/INR No results for input(s): LABPROT, INR in the last 72 hours. CMP     Component Value Date/Time   NA 137 06/30/2014 0432   K 4.8 06/30/2014 0432   CL 101 06/30/2014 0432   CO2 24 06/30/2014 0432   GLUCOSE 87 06/30/2014 0432   BUN 8 06/30/2014 0432   CREATININE 0.61 06/30/2014 0432   CALCIUM 8.6 06/30/2014 0432   PROT 6.9 06/29/2014 0757   ALBUMIN 3.3* 06/29/2014 0757   AST 41* 06/29/2014 0757   ALT 49* 06/29/2014 0757   ALKPHOS 61 06/29/2014 0757   BILITOT 0.2* 06/29/2014 0757   GFRNONAA >90 06/30/2014 0432   GFRAA >90 06/30/2014 0432   Lipase     Component Value Date/Time   LIPASE 59 06/29/2014 0757       Studies/Results: Ct Abdomen Pelvis W Contrast  06/29/2014   CLINICAL DATA:  Diffuse abdominal pain with bloody stools today.  History of hernia repair 2 weeks ago. Initial encounter.  EXAM: CT ABDOMEN AND PELVIS WITH CONTRAST  TECHNIQUE: Multidetector CT imaging of the abdomen and pelvis was performed using the standard protocol following bolus administration of intravenous contrast.  CONTRAST:  158mL OMNIPAQUE IOHEXOL 300 MG/ML  SOLN  COMPARISON:  Acute abdominal series done today. Abdominal pelvic CT 06/11/2014.  FINDINGS: Lower chest: Again demonstrated is a large hiatal hernia. The majority of the stomach is herniated into the lower left hemithorax. There is adjacent left lower lobe atelectasis. The right lung base is clear. There is no significant pleural or pericardial effusion.  Hepatobiliary: Mildly heterogeneous hepatic steatosis appear slightly improved. No focal liver lesions demonstrated. There is no significant biliary dilatation status post cholecystectomy.  Pancreas: Unremarkable. No pancreatic ductal dilatation or surrounding inflammatory changes.  Spleen: Normal in size without focal abnormality.  Adrenals/Urinary Tract: Both adrenal glands appear normal.The kidneys appear normal without evidence of urinary tract calculus or hydronephrosis. No bladder abnormalities are seen.  Stomach/Bowel: The proximal small bowel loops are mildly prominent in caliber, likely related to enteric contrast or a mild ileus. No evidence of bowel wall thickening, significant distention or surrounding inflammatory change.The appendix appears normal. There is no  ascites or focal extraluminal fluid collection. No inflammatory changes are demonstrated.  Vascular/Lymphatic: Small mesenteric lymph nodes are not pathologically enlarged. No significant vascular findings are present.  Reproductive: The uterus and ovaries appear normal. There is no adnexal mass.  Other: There has been interval revision of the ventral hernia repair. Associated mesh appears intact. There is a fairly homogeneous fluid collection within the subcutaneous fat of the anterior  abdominal wall which measures up to 11.1 x 6.1 cm transverse. No residual hernia is demonstrated.  Musculoskeletal: No acute or significant osseous findings. Previous left femoral ORIF noted.  IMPRESSION: 1. Interval repair of ventral hernia. There is a large fluid collection within the anterior abdominal wall, likely a postoperative seroma. No specific signs of superimposed infection identified. Correlate clinically. 2. Stable large hiatal hernia with herniation of the majority of the stomach into the lower left chest. 3. Possible minimal small bowel ileus. No evidence of obstruction or intra-abdominal inflammatory process.   Electronically Signed   By: Camie Patience M.D.   On: 06/29/2014 13:42    Anti-infectives: Anti-infectives    Start     Dose/Rate Route Frequency Ordered Stop   06/30/14 1600  ceFAZolin (ANCEF) IVPB 1 g/50 mL premix     1 g100 mL/hr over 30 Minutes Intravenous 3 times per day 06/30/14 1453         Assessment/Plan  1. S/p ventral hernia repair 2. Drainage of seroma 3. Chronic GI issues  Plan: 1. Increase her oral pain meds and try to wean her IV narcotics use 2. Dc c diff check and actually add miralax due to constipation 3. Will d/w Dr. Georgette Dover, but suspect we can stop her abx as she has no further signs of infection or need 4. Wound VAC at home 5. Plan for dc home tomorrow after VAC change   LOS: 2 days    [name] E 07/01/2014, 11:04 AM Pager: [phone]

## 2014-07-01 NOTE — Discharge Instructions (Signed)
Vacuum-Assisted Closure Therapy Vacuum-assisted closure (VAC) therapy uses a device that removes fluid and germs from wounds to help them heal. It is used on wounds that cannot be closed with stitches. They often heal slowly. Vacuum-assisted therapy helps the wound stay clean and healthy while the open wound slowly grows back together. Vacuum-assisted closure therapy uses a bandage (dressing) that is made of foam. It is put inside the wound. Then, a drape is placed over the wound. This drape sticks to your skin to keep air out, and to protect the wound. A tube is hooked up to a small pump and is attached to the drape. The pump sucks out the fluid and germs. Vacuum-assisted closure therapy can also help reduce the bad smell that comes from the wound. HOW DOES IT WORK?  The vacuum pump pulls fluid through the foam dressing. The dressing may wrinkle during this process. The fluid goes into the tube and away from the wound. The fluid then goes into a container. The fluid in the container must be replaced if it is full or at least once a week, even if the container is not full. The pulling from the pump helps to close the wound and bring better circulation to the wound area. The foam dressing covers and protects the wound. It helps your wound heal faster.  HOW DOES IT FEEL?   You might feel a little pulling when the pump is on.  You might also feel a mild vibrating sensation.  You might feel some discomfort when the dressing is taken off. CAN I MOVE AROUND WITH VACUUM-ASSISTED CLOSURE THERAPY? Yes, it has a backup battery which is used when the machine is not plugged in, as long as the battery is working, you can move freely. WHAT ARE SOME THINGS I MUST KNOW?  Do not turn off the pump yourself, unless instructed to do so by your healthcare provider, such as for bathing.  Do not take off the dressing yourself, unless instructed to do so by your caregiver.  You can wash or shower with the dressing.  However, do not take the pump into the shower. Make sure the wound dressing is protected and covered with plastic. The wound area must stay dry.  Do not turn off the pump for more than 2 hours. If the pump is off for more than 2 hours, your nurse must change your dressing.  Check frequently that the machine is on, that the machine indicates the therapy is on, and that all clamps are open. THE ALARM IS SOUNDING! WHAT SHOULD I DO?   Stay calm.  Do not turn off the pump or do anything with the dressing.  Call your clinic or caregiver right away if the alarm goes off and you cannot fix the problem. Some reasons the alarm might go off include:  The fluid collection container is full.  The battery is low.  The dressing has a leak.  Explain to your caregiver what is happening. Follow the instructions you receive. WHEN SHOULD I CALL FOR HELP?   You have severe pain.  You have difficulty breathing.  You have bleeding that will not stop.  Your wound smells bad.  You have redness, swelling, or fluid leaking from your wound.  Your alarm goes off and you do not know what to do.  You have a fever.  Your wound itches severely.  Your dressing changes are often painful or bleeding often occurs.  You have diarrhea.  You have a sore throat.  You have a rash around the dressing or anywhere else on your body.  You feel nauseous.  You feel dizzy or weak.  The [facility] machine has been off for more than 2 hours. HOW DO I GET READY TO GO HOME WITH A PUMP?  A trained caregiver will talk to you and answer your questions about your vacuum-assisted closure therapy before you go home. He or she will explain what to expect. A caregiver will come to your home to apply the pump and care for your wound. The at-home caregiver will be available for questions and will come back for the scheduled dressing changes, usually every 48-72 hours (or more often for severely infected wounds). Your at-home  caregiver will also come if you are having an unexpected problem. If you have questions or do not know what to do when you go home, talk to your healthcare provider. Document Released: 07/13/2008 Document Revised: 04/02/2013 Document Reviewed: 07/14/2011 [facility] Patient Information 2015 [place], [place]. This information is not intended to replace advice given to you by your health care provider. Make sure you discuss any questions you have with your health care provider.  [facility], [place] [phone]  OPEN ABDOMINAL SURGERY: POST OP INSTRUCTIONS  Always review your discharge instruction sheet given to you by the facility where your surgery was performed.  IF YOU HAVE DISABILITY OR FAMILY LEAVE FORMS, YOU MUST BRING THEM TO THE OFFICE FOR PROCESSING.  PLEASE DO NOT GIVE THEM TO YOUR DOCTOR.  1. A prescription for pain medication may be given to you upon discharge.  Take your pain medication as prescribed, if needed.  If narcotic pain medicine is not needed, then you may take acetaminophen (Tylenol) or ibuprofen (Advil) as needed. 2. Take your usually prescribed medications unless otherwise directed. 3. If you need a refill on your pain medication, please contact your pharmacy. They will contact our office to request authorization.  Prescriptions will not be filled after 5pm or on week-ends. 4. You should follow a light diet the first few days after arrival home, such as soup and crackers, pudding, etc.unless your doctor has advised otherwise. A high-fiber, low fat diet can be resumed as tolerated.   Be sure to include lots of fluids daily. Most patients will experience some swelling and bruising on the chest and neck area.  Ice packs will help.  Swelling and bruising can take several days to resolve 5. Most patients will experience some swelling and bruising in the area of the incision. Ice pack will help. Swelling and bruising can take several days to resolve..  6. It is  common to experience some constipation if taking pain medication after surgery.  Increasing fluid intake and taking a stool softener will usually help or prevent this problem from occurring.  A mild laxative (Milk of Magnesia or Miralax) should be taken according to package directions if there are no bowel movements after 48 hours. 7.  You may have steri-strips (small skin tapes) in place directly over the incision.  These strips should be left on the skin for 7-10 days.  If your surgeon used skin glue on the incision, you may shower in 24 hours.  The glue will flake off over the next 2-3 weeks.  Any sutures or staples will be removed at the office during your follow-up visit. You may find that a light gauze bandage over your incision may keep your staples from being rubbed or pulled. You may shower and  replace the bandage daily. 8. ACTIVITIES:  You may resume regular (light) daily activities beginning the next day--such as daily self-care, walking, climbing stairs--gradually increasing activities as tolerated.  You may have sexual intercourse when it is comfortable.  Refrain from any heavy lifting or straining until approved by your doctor. a. You may drive when you no longer are taking prescription pain medication, you can comfortably wear a seatbelt, and you can safely maneuver your car and apply brakes b. Return to Work: ___________________________________ 61. You should see your doctor in the office for a follow-up appointment approximately two weeks after your surgery.  Make sure that you call for this appointment within a day or two after you arrive home to insure a convenient appointment time. OTHER INSTRUCTIONS:  _____________________________________________________________ _____________________________________________________________  WHEN TO CALL YOUR DOCTOR: 1. Fever over 101.0 2. Inability to urinate 3. Nausea and/or vomiting 4. Extreme swelling or bruising 5. Continued bleeding from  incision. 6. Increased pain, redness, or drainage from the incision. 7. Difficulty swallowing or breathing 8. Muscle cramping or spasms. 9. Numbness or tingling in hands or feet or around lips.  The clinic staff is available to answer your questions during regular business hours.  Please dont hesitate to call and ask to speak to one of the nurses if you have concerns.  For further questions, please visit [email or web address]

## 2014-07-14 ENCOUNTER — Telehealth (INDEPENDENT_AMBULATORY_CARE_PROVIDER_SITE_OTHER): Payer: Self-pay

## 2014-07-14 NOTE — Telephone Encounter (Signed)
Pt was discharged on 11/18 s/p ventral hernia repair with primary closure. Discharge states that she needs to see Dr Hulen Skains in 2 weeks. No openings for clinic on 07/28/14. Informed pt that I would send Dr Hulen Skains a message to see when he would like to see her in the office and we would contact her as soon as we receive a message. Pt verbalized understanding

## 2014-07-15 ENCOUNTER — Ambulatory Visit (HOSPITAL_COMMUNITY): Payer: Self-pay | Admitting: Psychiatry

## 2014-07-16 ENCOUNTER — Telehealth (INDEPENDENT_AMBULATORY_CARE_PROVIDER_SITE_OTHER): Payer: Self-pay

## 2014-07-16 NOTE — Telephone Encounter (Signed)
Home health calling into office to make aware that patient no longer needs wound vac.  Home health states wound has closed at this time.

## 2014-07-27 ENCOUNTER — Emergency Department (HOSPITAL_COMMUNITY): Payer: BC Managed Care – PPO

## 2014-07-27 ENCOUNTER — Encounter (HOSPITAL_COMMUNITY): Payer: Self-pay | Admitting: Physical Medicine and Rehabilitation

## 2014-07-27 ENCOUNTER — Emergency Department (HOSPITAL_COMMUNITY)
Admission: EM | Admit: 2014-07-27 | Discharge: 2014-07-27 | Disposition: A | Discharge status: Home / Self Care | Payer: BC Managed Care – PPO | Attending: Emergency Medicine | Admitting: Emergency Medicine

## 2014-07-27 DIAGNOSIS — R109 Unspecified abdominal pain: Secondary | ICD-10-CM | POA: Diagnosis present

## 2014-07-27 DIAGNOSIS — F419 Anxiety disorder, unspecified: Secondary | ICD-10-CM | POA: Diagnosis not present

## 2014-07-27 DIAGNOSIS — R112 Nausea with vomiting, unspecified: Secondary | ICD-10-CM | POA: Insufficient documentation

## 2014-07-27 DIAGNOSIS — I10 Essential (primary) hypertension: Secondary | ICD-10-CM | POA: Diagnosis not present

## 2014-07-27 DIAGNOSIS — R197 Diarrhea, unspecified: Secondary | ICD-10-CM | POA: Insufficient documentation

## 2014-07-27 DIAGNOSIS — R1084 Generalized abdominal pain: Secondary | ICD-10-CM | POA: Diagnosis not present

## 2014-07-27 DIAGNOSIS — Z9049 Acquired absence of other specified parts of digestive tract: Secondary | ICD-10-CM | POA: Insufficient documentation

## 2014-07-27 DIAGNOSIS — Z86711 Personal history of pulmonary embolism: Secondary | ICD-10-CM | POA: Diagnosis not present

## 2014-07-27 DIAGNOSIS — F329 Major depressive disorder, single episode, unspecified: Secondary | ICD-10-CM | POA: Insufficient documentation

## 2014-07-27 DIAGNOSIS — Z79899 Other long term (current) drug therapy: Secondary | ICD-10-CM | POA: Diagnosis not present

## 2014-07-27 DIAGNOSIS — Z9851 Tubal ligation status: Secondary | ICD-10-CM | POA: Insufficient documentation

## 2014-07-27 DIAGNOSIS — Z720 Tobacco use: Secondary | ICD-10-CM | POA: Insufficient documentation

## 2014-07-27 DIAGNOSIS — Z3202 Encounter for pregnancy test, result negative: Secondary | ICD-10-CM | POA: Diagnosis not present

## 2014-07-27 DIAGNOSIS — J45909 Unspecified asthma, uncomplicated: Secondary | ICD-10-CM | POA: Insufficient documentation

## 2014-07-27 LAB — URINALYSIS, ROUTINE W REFLEX MICROSCOPIC
BILIRUBIN URINE: NEGATIVE
Glucose, UA: NEGATIVE mg/dL
Hgb urine dipstick: NEGATIVE
KETONES UR: NEGATIVE mg/dL
Leukocytes, UA: NEGATIVE
NITRITE: NEGATIVE
Protein, ur: NEGATIVE mg/dL
Specific Gravity, Urine: 1.023 (ref 1.005–1.030)
Urobilinogen, UA: 0.2 mg/dL (ref 0.0–1.0)
pH: 5.5 (ref 5.0–8.0)

## 2014-07-27 LAB — COMPREHENSIVE METABOLIC PANEL
ALT: 29 U/L (ref 0–35)
ANION GAP: 11 (ref 5–15)
AST: 18 U/L (ref 0–37)
Albumin: 3.4 g/dL — ABNORMAL LOW (ref 3.5–5.2)
Alkaline Phosphatase: 62 U/L (ref 39–117)
BILIRUBIN TOTAL: 0.3 mg/dL (ref 0.3–1.2)
BUN: 13 mg/dL (ref 6–23)
CHLORIDE: 106 meq/L (ref 96–112)
CO2: 24 meq/L (ref 19–32)
CREATININE: 0.54 mg/dL (ref 0.50–1.10)
Calcium: 9.2 mg/dL (ref 8.4–10.5)
GFR calc Af Amer: >90 mL/min (ref >90)
GFR calc non Af Amer: >90 mL/min (ref >90)
GLUCOSE: 106 mg/dL — AB (ref 70–99)
Potassium: 4.4 mEq/L (ref 3.7–5.3)
Sodium: 141 mEq/L (ref 137–147)
Total Protein: 7 g/dL (ref 6.0–8.3)

## 2014-07-27 LAB — CBC WITH DIFFERENTIAL/PLATELET
Basophils Absolute: 0 10*3/uL (ref 0.0–0.1)
Basophils Relative: 0 % (ref 0–1)
Eosinophils Absolute: 0.2 10*3/uL (ref 0.0–0.7)
Eosinophils Relative: 2 % (ref 0–5)
HEMATOCRIT: 40.3 % (ref 36.0–46.0)
HEMOGLOBIN: 13.3 g/dL (ref 12.0–15.0)
LYMPHS ABS: 2.8 10*3/uL (ref 0.7–4.0)
LYMPHS PCT: 27 % (ref 12–46)
MCH: 30.6 pg (ref 26.0–34.0)
MCHC: 33 g/dL (ref 30.0–36.0)
MCV: 92.9 fL (ref 78.0–100.0)
MONO ABS: 0.6 10*3/uL (ref 0.1–1.0)
MONOS PCT: 5 % (ref 3–12)
NEUTROS ABS: 6.8 10*3/uL (ref 1.7–7.7)
Neutrophils Relative %: 66 % (ref 43–77)
Platelets: 331 10*3/uL (ref 150–400)
RBC: 4.34 MIL/uL (ref 3.87–5.11)
RDW: 13.9 % (ref 11.5–15.5)
WBC: 10.3 10*3/uL (ref 4.0–10.5)

## 2014-07-27 LAB — LIPASE, BLOOD: LIPASE: 47 U/L (ref 11–59)

## 2014-07-27 LAB — POC URINE PREG, ED: Preg Test, Ur: NEGATIVE

## 2014-07-27 MED ORDER — HYDROMORPHONE HCL 1 MG/ML IJ SOLN
1.0000 mg | Freq: Once | INTRAMUSCULAR | Status: AC
  Administered 2014-07-27: 1 mg via INTRAVENOUS
  Filled 2014-07-27: qty 1

## 2014-07-27 MED ORDER — LORAZEPAM 2 MG/ML IJ SOLN
0.5000 mg | Freq: Once | INTRAMUSCULAR | Status: AC
  Administered 2014-07-27: 0.5 mg via INTRAVENOUS
  Filled 2014-07-27: qty 1

## 2014-07-27 MED ORDER — RIVAROXABAN 15 MG PO TABS
15.0000 mg | ORAL_TABLET | Freq: Once | ORAL | Status: AC
  Administered 2014-07-27: 15 mg via ORAL
  Filled 2014-07-27: qty 1

## 2014-07-27 MED ORDER — HYDROCODONE-ACETAMINOPHEN 5-325 MG PO TABS: ORAL_TABLET | ORAL | Status: DC

## 2014-07-27 MED ORDER — HYDROCODONE-ACETAMINOPHEN 5-325 MG PO TABS
2.0000 | ORAL_TABLET | Freq: Once | ORAL | Status: AC
  Administered 2014-07-27: 2 via ORAL
  Filled 2014-07-27: qty 2

## 2014-07-27 MED ORDER — MORPHINE SULFATE 4 MG/ML IJ SOLN
4.0000 mg | Freq: Once | INTRAMUSCULAR | Status: DC
  Filled 2014-07-27: qty 1

## 2014-07-27 MED ORDER — XARELTO VTE STARTER PACK 15 & 20 MG PO TBPK: 15.0000 mg | ORAL_TABLET | ORAL | Status: DC

## 2014-07-27 MED ORDER — ONDANSETRON HCL 4 MG/2ML IJ SOLN
4.0000 mg | Freq: Once | INTRAMUSCULAR | Status: AC
  Administered 2014-07-27: 4 mg via INTRAVENOUS
  Filled 2014-07-27: qty 2

## 2014-07-27 MED ORDER — DICYCLOMINE HCL 20 MG PO TABS: 20.0000 mg | ORAL_TABLET | Freq: Two times a day (BID) | ORAL | Status: DC

## 2014-07-27 MED ORDER — ONDANSETRON 4 MG PO TBDP: 4.0000 mg | ORAL_TABLET | Freq: Three times a day (TID) | ORAL | Status: DC | PRN

## 2014-07-27 NOTE — ED Provider Notes (Signed)
CSN: [id number]     Arrival date & time 07/27/14  1421 History   First MD Initiated Contact with Patient 07/27/14 1749     Chief Complaint  Patient presents with  . Abdominal Pain  . Emesis  . Diarrhea     (Consider location/radiation/quality/duration/timing/severity/associated sxs/prior Treatment) HPI Comments: Patient with history of hiatal hernia currently no treatment, status post ventral hernia repair on 06/13/14, subsequent hospitalization in November 2015 where she had drainage of postoperative seroma -- presents with complaint of nausea, vomiting, left flank pain described as a burning. Pain has been severe at times. She is unable to work due to the pain. Patient states that this is very similar to the pain that she was having before her hernia repair which has been a chronic problem for her. Patient states that she has improved pain in the area of the hernia however this problem is now resolved s/p surgery. She has had multiple episodes of dry heaving today without production of vomitus. Patient had "foam diarrhea" today without blood. She denies urinary symptoms including dysuria and hematuria. No chest pain or shortness of breath. No fevers. She has not tried taking any of her home pain medications for this. Patient has not seen a gastroenterologist in the past. She has a PCP appointment scheduled but has not yet had her initial appointment. Patient states that she's recently moved to this area. She is very anxious about her chronic pains. The onset of this condition was acute and recurrent. The course is constant. Aggravating factors: none. Alleviating factors: none.   Patient is on Xarelto due to PE's in past. She has her first surgery follow-up since previous hospitalization tomorrow.   The history is provided by the patient and medical records.    Past Medical History  Diagnosis Date  . PE (pulmonary embolism)     xarelto  . Asthma   . Hypertension     Lisinopril  .  Depression   . Anxiety    Past Surgical History  Procedure Laterality Date  . Ventral hernia repair N/A 06/13/2014    Procedure: HERNIA REPAIR VENTRAL ADULT;  Surgeon: Doreen Salvage, MD;  Location: [facility];  Service: General;  Laterality: N/A;  . Cholecystectomy    . Hip fracture surgery Left 2010  . Tubal ligation    . Ablation on endometriosis    . Ankle fracture surgery     Family History  Problem Relation Age of Onset  . Bipolar disorder Mother   . Bipolar disorder Father   . Depression Brother    History  Substance Use Topics  . Smoking status: Current Every Day Smoker -- 0.50 packs/day for 10 years    Types: Cigarettes  . Smokeless tobacco: Never Used  . Alcohol Use: No   OB History    No data available     Review of Systems  Constitutional: Negative for fever.  HENT: Negative for rhinorrhea and sore throat.   Eyes: Negative for redness.  Respiratory: Negative for cough.   Cardiovascular: Negative for chest pain.  Gastrointestinal: Positive for nausea, abdominal pain and diarrhea. Negative for vomiting.  Genitourinary: Positive for flank pain. Negative for dysuria, frequency and hematuria.  Musculoskeletal: Negative for myalgias.  Skin: Negative for rash.  Neurological: Negative for headaches.    Allergies  Levaquin  Home Medications   Prior to Admission medications   Medication Sig Start Date End Date Taking? Authorizing Provider  albuterol (PROVENTIL HFA;VENTOLIN HFA) 108 (90 BASE) MCG/ACT inhaler Inhale 2  puffs into the lungs every 6 (six) hours as needed for wheezing or shortness of breath. 04/27/14  Yes Olam Idler, MD  escitalopram (LEXAPRO) 20 MG tablet Take 20 mg by mouth daily.   Yes Historical Provider, MD  lisinopril-hydrochlorothiazide (PRINZIDE,ZESTORETIC) 20-12.5 MG per tablet Take 1 tablet by mouth 2 (two) times daily.   Yes Historical Provider, MD  rivaroxaban (XARELTO) 20 MG TABS tablet Take 1 tablet (20 mg total) by mouth daily with supper.  04/27/14  Yes Olam Idler, MD  zolpidem (AMBIEN) 10 MG tablet Take 10 mg by mouth at bedtime as needed for sleep.   Yes Historical Provider, MD   BP 163/88 mmHg  Pulse 61  Temp(Src) 98.1 F (36.7 C) (Oral)  Resp 21  Ht 5\' 1"  (1.549 m)  Wt 222 lb 4 oz (100.812 kg)  BMI 42.02 kg/m2  SpO2 98%  LMP 06/14/2014   Physical Exam  Constitutional: She appears well-developed and well-nourished.  HENT:  Head: Normocephalic and atraumatic.  Eyes: Conjunctivae are normal. Right eye exhibits no discharge. Left eye exhibits no discharge.  Neck: Normal range of motion. Neck supple.  Cardiovascular: Normal rate, regular rhythm and normal heart sounds.   Pulmonary/Chest: Effort normal and breath sounds normal. No respiratory distress. She has no wheezes. She has no rales.  Abdominal: Soft. Bowel sounds are normal. There is tenderness (Generally tender). There is no rebound, no guarding and no CVA tenderness.  Periumbilical surgical scar appears to be healing well with no active drainage.   Musculoskeletal: She exhibits no edema or tenderness.  Neurological: She is alert.  Skin: Skin is warm and dry.  Psychiatric: She has a normal mood and affect.  Pt is tearful.   Nursing note and vitals reviewed.   ED Course  Procedures (including critical care time) Labs Review Labs Reviewed  COMPREHENSIVE METABOLIC PANEL - Abnormal; Notable for the following:    Glucose, Bld 106 (*)    Albumin 3.4 (*)    All other components within normal limits  URINALYSIS, ROUTINE W REFLEX MICROSCOPIC - Abnormal; Notable for the following:    APPearance HAZY (*)    All other components within normal limits  CBC WITH DIFFERENTIAL  LIPASE, BLOOD  POC URINE PREG, ED    Imaging Review Dg Abd 2 Views  07/27/2014   CLINICAL DATA:  Left-sided abdominal and back pain  EXAM: ABDOMEN - 2 VIEW  COMPARISON:  06/29/2014  FINDINGS: There is no free intraperitoneal gas on the upright view. Postcholecystectomy staples. Normal  disproportionate dilatation of bowel. No pneumatosis.  IMPRESSION: Nonobstructive bowel gas pattern.   Electronically Signed   By: Maryclare Bean M.D.   On: 07/27/2014 19:58     EKG Interpretation None       6:56 PM Patient seen and examined. Work-up initiated. Medications ordered.   Vital signs reviewed and are as follows: BP 163/88 mmHg  Pulse 61  Temp(Src) 98.1 F (36.7 C) (Oral)  Resp 21  Ht 5\' 1"  (1.549 m)  Wt 222 lb 4 oz (100.812 kg)  BMI 42.02 kg/m2  SpO2 98%  LMP 06/14/2014  8:08 PM X-ray reviewed. No signs of obstruction. Ice chips given. Patient continues to have pain. Additional dose of pain medication ordered.     Patient with much improved pain after 2nd dose of pain medication. Pt seen by and discussed with Dr. Aline Brochure.   Will discharge with Vicodin for pain (this has worked better for her in past), zofran for nausea, bentyl to  try for abdominal pain. She is encouraged to follow-up with surgery in the morning as planned.   The patient was urged to return to the Emergency Department immediately with worsening of current symptoms, worsening abdominal pain, persistent vomiting, blood noted in stools, fever, or any other concerns. The patient verbalized understanding.   Patient counseled on use of narcotic pain medications. Counseled not to combine these medications with others containing tylenol. Urged not to drink alcohol, drive, or perform any other activities that requires focus while taking these medications. The patient verbalizes understanding and agrees with the plan.   Prior to discharge -- patient states that she has been unable to afford $300 for Xarelto and she ran out approximately a week ago. I spoke with care manager who was able to get patient discounted rx for next month/year. Dose of Xarelto given in ED. Hopefully this will help keep patient compliant with her anticoagulation. Patient voices that she has not been evaluated by hematologist in some time. I  encouraged her to discuss this with PCP.    MDM   Final diagnoses:  Flank pain  Non-intractable vomiting with nausea, vomiting of unspecified type   Patient with abd/N/V in setting of recently ventral hernia repair, complicated by post-op seroma and need for wound vac. Her hernia symptoms appear to have been improving. Her flank/abd pain today is the same as what has been happening for sometime without new features except for being particularly severe today. Symptoms controlled in ED. Labs are reassuring. I do not feel additional imaging indicated now with no new features, no fever, no intractable sx. She has had 2 CTs in past 2 months. She would likely benefit from GI evaluation given the nature of her pain. Referral given. Vitals are stable. No signs of dehydration, tolerating PO's. Lungs are clear. No focal abdominal pain, no concern for appendicitis, cholecystitis (previous cholecystectomy), pancreatitis, ruptured viscus, UTI, kidney stone, or any other emergent abdominal etiology. Supportive therapy indicated with return if symptoms worsen.       Carlisle Cater, PA-C 07/28/14 1141  Pamella Pert, MD 07/30/14 1239

## 2014-07-27 NOTE — ED Notes (Signed)
Pt presents to department for evaluation of diffuse abdominal pain radiating to back, also states N/V/D. Onset this morning. 10/10 pain upon arrival to ED. Denies urinary symptoms. Pt is alert and oriented x4.

## 2014-07-27 NOTE — Progress Notes (Signed)
ED CM consulted by [name] concerning anti coagulant therapy assistance. [facility] presented to [name] with c/o Abdominal Pain, emesis, diarrhea,  Patient has an appointment with PCP in 3 weeks. Patient reports being  on xarelto but has run out, and  cannot afford the medication, as per patient. Met with patient at bedside to discuss possible medication assistance for xarelto. Provided patient with free trial card to redeem at retail pharmacy along with a Xarelto prescription. A monthly 0 Copay card also given to patient. Patient agreeable to disposition plan  Patient verbalized understanding and appreciation for the assistance. Updated [facility] PA-C  he agrees with the disposition plan. No further CM needs identified.

## 2014-07-27 NOTE — Discharge Instructions (Signed)
Please read and follow all provided instructions.  Your diagnoses today include:  1. Non-intractable vomiting with nausea, vomiting of unspecified type   2. Flank pain     Tests performed today include:  Blood counts and electrolytes  Blood tests to check liver and kidney function  Blood tests to check pancreas function  Urine test to look for infection and pregnancy (in women)  X-ray of your abdomen - no signs of blockage or other problem  Vital signs. See below for your results today.   Medications prescribed:   Vicodin (hydrocodone/acetaminophen) - narcotic pain medication  DO NOT drive or perform any activities that require you to be awake and alert because this medicine can make you drowsy. BE VERY CAREFUL not to take multiple medicines containing Tylenol (also called acetaminophen). Doing so can lead to an overdose which can damage your liver and cause liver failure and possibly death.   Zofran (ondansetron) - for nausea and vomiting   Xarelto - blood thinner    Bentyl - anti-abdominal spasm medication  Take any prescribed medications only as directed.  Home care instructions:   Follow any educational materials contained in this packet.  Follow-up instructions: Please follow-up with your surgeon as planned tomorrow and your primary care doctor as scheduled.     Return instructions:  SEEK IMMEDIATE MEDICAL ATTENTION IF:  The pain does not go away or becomes severe   A temperature above 101F develops   Repeated vomiting occurs (multiple episodes)   The pain becomes localized to portions of the abdomen. The right side could possibly be appendicitis. In an adult, the left lower portion of the abdomen could be colitis or diverticulitis.   Blood is being passed in stools or vomit (bright red or black tarry stools)   You develop chest pain, difficulty breathing, dizziness or fainting, or become confused, poorly responsive, or inconsolable (young  children)  If you have any other emergent concerns regarding your health  Additional Information: Abdominal (belly) pain can be caused by many things. Your caregiver performed an examination and possibly ordered blood/urine tests and imaging (CT scan, x-rays, ultrasound). Many cases can be observed and treated at home after initial evaluation in the emergency department. Even though you are being discharged home, abdominal pain can be unpredictable. Therefore, you need a repeated exam if your pain does not resolve, returns, or worsens. Most patients with abdominal pain don't have to be admitted to the hospital or have surgery, but serious problems like appendicitis and gallbladder attacks can start out as nonspecific pain. Many abdominal conditions cannot be diagnosed in one visit, so follow-up evaluations are very important.  Your vital signs today were: BP 124/81 mmHg   Pulse 61   Temp(Src) 98.1 F (36.7 C) (Oral)   Resp 16   Ht 5\' 1"  (1.549 m)   Wt 222 lb 4 oz (100.812 kg)   BMI 42.02 kg/m2   SpO2 94%   LMP 06/14/2014 If your blood pressure (bp) was elevated above 135/85 this visit, please have this repeated by your doctor within one month. --------------

## 2014-07-29 ENCOUNTER — Ambulatory Visit (INDEPENDENT_AMBULATORY_CARE_PROVIDER_SITE_OTHER): Payer: BC Managed Care – PPO | Admitting: Medical

## 2014-07-29 ENCOUNTER — Encounter (HOSPITAL_COMMUNITY): Payer: Self-pay | Admitting: Psychiatry

## 2014-07-29 VITALS — BP 153/82 | HR 70 | Ht 61.0 in | Wt 225.2 lb

## 2014-07-29 DIAGNOSIS — F4310 Post-traumatic stress disorder, unspecified: Secondary | ICD-10-CM

## 2014-07-29 DIAGNOSIS — F410 Panic disorder [episodic paroxysmal anxiety] without agoraphobia: Secondary | ICD-10-CM

## 2014-07-29 DIAGNOSIS — F331 Major depressive disorder, recurrent, moderate: Secondary | ICD-10-CM

## 2014-07-29 MED ORDER — ESCITALOPRAM OXALATE 20 MG PO TABS: 20.0000 mg | ORAL_TABLET | Freq: Every day | ORAL | Status: DC

## 2014-07-29 MED ORDER — DIAZEPAM 5 MG PO TABS: 5.0000 mg | ORAL_TABLET | ORAL | Status: DC | PRN
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
## 2014-08-05 ENCOUNTER — Encounter: Payer: Self-pay | Admitting: *Deleted

## 2014-08-06 ENCOUNTER — Encounter: Payer: Self-pay | Admitting: Nurse Practitioner

## 2014-08-06 ENCOUNTER — Ambulatory Visit (INDEPENDENT_AMBULATORY_CARE_PROVIDER_SITE_OTHER): Payer: BC Managed Care – PPO | Admitting: Nurse Practitioner

## 2014-08-06 VITALS — BP 106/64 | HR 84 | Ht 60.25 in | Wt 223.4 lb

## 2014-08-06 DIAGNOSIS — R112 Nausea with vomiting, unspecified: Secondary | ICD-10-CM

## 2014-08-06 DIAGNOSIS — R197 Diarrhea, unspecified: Secondary | ICD-10-CM

## 2014-08-06 DIAGNOSIS — R1084 Generalized abdominal pain: Secondary | ICD-10-CM

## 2014-08-06 DIAGNOSIS — R634 Abnormal weight loss: Secondary | ICD-10-CM

## 2014-08-06 MED ORDER — HYDROCODONE-ACETAMINOPHEN 5-325 MG PO TABS: ORAL_TABLET | ORAL | Status: DC

## 2014-08-06 NOTE — Progress Notes (Addendum)
HPI :  Patient is a 43 year old female, new to this practice, referred by the emergency department for evaluation of abdominal pain, nausea and vomiting. Patient has a longstanding history of chronic, intermittent loose stools but since the summer she has been having frequent episodes of diffuse abdominal pain associated with urges to defecate. Stools are liquid and "foamy" . She has frequent urges to defecate throughout the day   Patient underwent repair of an incarcerated ventral hernia in the late October. She was readmitted mid November with a large seroma requiring drainage and wound vac. She is now recovering from all of the hernia related issues but her bowel problems are still present. Patient has been on Vicodin since hernia repair and has noticed that her bowel movements are actually normal.  Without Vicodin her abdominal pain is horrible and stools are back to liquid / foam.  Patient has no blood in her stools. She reports a 60 pound weight loss since September. Attributes the weight loss to diarrhea and abdominal pain. Patient reports passage of black stool but none since her ventral hernia repair . Sounds like she had a colonoscopy with polypectomy and upper endoscopy approximately 12 years ago in [facility].   Patient was evaluated for nausea, vomiting, and abdominal pain in the emergency department on the fourteenth of this month. Emergency department CBC and CMET were normal. CTScan not done. Patient states she recently had outpatient surgical follow up and all surgical issues have been resolved.   Patient has a history of pulmonary embolisms, she is on chronic Xarelto  Past Medical History  Diagnosis Date  . PE (pulmonary embolism)     xarelto  . Asthma   . Hypertension     Lisinopril  . Depression   . Anxiety   . Hiatal hernia     Stable large hiatal hernia with herniation  . Anal fissure 2005  . Anemia 2003  . Colon polyp 2005  . History of blood clots 2003  .  Gallstones 1997  . Obesity   . Pneumonia 07/2013  . Paraesophageal hernia 2004    Family History  Problem Relation Age of Onset  . Bipolar disorder Mother   . Bipolar disorder Father   . Depression Brother   . Breast cancer Paternal Grandmother   . Clotting disorder Child     x3; Von Willabrams disease  . Colon cancer Maternal Grandfather   . Colon polyps Neg Hx   . Crohn's disease Brother   . Diabetes Neg Hx   . Kidney disease Neg Hx   . Esophageal cancer Neg Hx   . Gallbladder disease Neg Hx    History  Substance Use Topics  . Smoking status: Current Every Day Smoker -- 0.50 packs/day for 10 years    Types: Cigarettes  . Smokeless tobacco: Never Used  . Alcohol Use: No   Current Outpatient Prescriptions  Medication Sig Dispense Refill  . albuterol (PROVENTIL HFA;VENTOLIN HFA) 108 (90 BASE) MCG/ACT inhaler Inhale 2 puffs into the lungs every 6 (six) hours as needed for wheezing or shortness of breath. 1 Inhaler 0  . diazepam (VALIUM) 5 MG tablet Take 1 tablet (5 mg total) by mouth as needed for anxiety. 30 tablet 0  . dicyclomine (BENTYL) 20 MG tablet Take 1 tablet (20 mg total) by mouth 2 (two) times daily. 20 tablet 0  . escitalopram (LEXAPRO) 20 MG tablet Take 1 tablet (20 mg total) by mouth daily. 30 tablet 1  . HYDROcodone-acetaminophen (  NORCO/VICODIN) 5-325 MG per tablet Take 1-2 tablets every 6 hours as needed for severe pain 12 tablet 0  . lisinopril-hydrochlorothiazide (PRINZIDE,ZESTORETIC) 20-12.5 MG per tablet Take 1 tablet by mouth 2 (two) times daily.    . ondansetron (ZOFRAN ODT) 4 MG disintegrating tablet Take 1 tablet (4 mg total) by mouth every 8 (eight) hours as needed for nausea or vomiting. 10 tablet 0  . XARELTO STARTER PACK 15 & 20 MG TBPK Take 15-20 mg by mouth as directed. Take as directed on package: Start with one 15mg  tablet by mouth twice a day with food. On Day 22, switch to one 20mg  tablet once a day with food. 51 each 0  . zolpidem (AMBIEN) 10 MG  tablet Take 10 mg by mouth at bedtime as needed for sleep.     No current facility-administered medications for this visit.   Allergies  Allergen Reactions  . Levaquin [Levofloxacin In D5w] Hives     Review of Systems: Anxiety, back pain, fatigue, sleeping problems. All other systems reviewed and negative except where noted in HPI.    Dg Abd 2 Views  07/27/2014   CLINICAL DATA:  Left-sided abdominal and back pain  EXAM: ABDOMEN - 2 VIEW  COMPARISON:  06/29/2014  FINDINGS: There is no free intraperitoneal gas on the upright view. Postcholecystectomy staples. Normal disproportionate dilatation of bowel. No pneumatosis.  IMPRESSION: Nonobstructive bowel gas pattern.   Electronically Signed   By: Maryclare Bean M.D.   On: 07/27/2014 19:58    Physical Exam: BP 106/64 mmHg  Pulse 84  Ht 5' 0.25" (1.53 m)  Wt 223 lb 6 oz (101.322 kg)  BMI 43.28 kg/m2  LMP 07/16/2014 Constitutional: Pleasant,well-developed, morbidly obese female in no acute distress. HEENT: Normocephalic and atraumatic. Conjunctivae are normal. No scleral icterus. Neck supple.  Cardiovascular: Normal rate, regular rhythm.  Pulmonary/chest: Effort normal and breath sounds normal. No wheezing, rales or rhonchi. Abdominal: Soft, obese, mild BLQ tenderness. Bowel sounds active throughout. There are no masses palpable. No hepatomegaly. Extremities: no edema Lymphadenopathy: No cervical adenopathy noted. Neurological: Alert and oriented to person place and time. Skin: Skin is warm and dry. No rashes noted. Psychiatric: Normal mood and affect. Behavior is normal.   ASSESSMENT AND PLAN:  54. 43 year old female with chronic, intermittent, bowel changes progressively worse over last several months and associated with diffuse abdominal pain., Severe IBS suspected but needs colonoscopy to exclude other etiologies. Patient agreeable to proceed with colonoscopy. The risks, benefits, and alternatives to colonoscopy with possible biopsy  and possible polypectomy were discussed with the patient and she consents to proceed. Patient requests refill of Vicodin. I  did give her a prescription but made clear that we do not routinely prescribe narcotics so future refills would be unlikely.     2. nausea and vomiting and 60 pound weight loss. Etiology unclear. She does have a large hiatal hernia with herniation of the majority of the stomach into the lower left chest on CTscan . She also reports black stools a few months back. For further evaluation will schedule her for EGD to be done at time of colonoscopy, The benefits, risks, and potential complications of EGD with possible biopsies  were discussed with the patient and she agrees to proceed. It may be helpful to get a barium swallow for further evaluation of hernia prior to EGD but will let her primary GI, Dr. Hilarie Fredrickson decide if that is warranted   3.  Morbid obesity.   4. Personal history of  colon polyps 12 years ago. No records available. Nature of polyps unknown.   5. Ventral hernia repair late October. Post-op development of large seroma requiring drainage and wound vac. No acute surgical issues now.   6. History of PE, ?etiology. On chronic Xarelto. Will contact prescribing provider about holding medication for endoscopies.   Addendum: Reviewed and agree with initial management. Jerene Bears, MD

## 2014-08-06 NOTE — Telephone Encounter (Signed)
  RE: Courtney [name]. DOB: 07/13/1964 MRN: [id number]   Dear Dr. Burt Knack,    We have scheduled the above patient for an endoscopic procedure. Our records show that he is on anticoagulation therapy.   Please advise as to how long the patient may come off his therapy of Plavix prior to the procedure, which is scheduled for 10-07-2014.  Please route the completed form to [employer] CMA  Sincerely,    [place], Precious Bard

## 2014-08-06 NOTE — Telephone Encounter (Signed)
ERROR

## 2014-08-06 NOTE — Patient Instructions (Addendum)
You have been scheduled for an endoscopy and colonoscopy and you will need to come back prior to your procedure to have a pre-visit. Your physician has requested that you go to www.startemmi.com and enter the access code given to you at your visit today. This web site gives a general overview about your procedure. However, you should still follow specific instructions given to you by our office regarding your preparation for the procedure.  We will attempt to get clearance for your xarelto from [place] PA -C. Prior to your pre-visit.   Today you have been given an rx for vicodin to take to the pharmacy.   I appreciate the opportunity to care for you.

## 2014-08-07 ENCOUNTER — Encounter: Payer: Self-pay | Admitting: Nurse Practitioner

## 2014-08-07 DIAGNOSIS — R197 Diarrhea, unspecified: Secondary | ICD-10-CM | POA: Insufficient documentation

## 2014-08-07 DIAGNOSIS — R634 Abnormal weight loss: Secondary | ICD-10-CM | POA: Insufficient documentation

## 2014-08-07 DIAGNOSIS — R1084 Generalized abdominal pain: Secondary | ICD-10-CM | POA: Insufficient documentation

## 2014-08-07 DIAGNOSIS — R112 Nausea with vomiting, unspecified: Secondary | ICD-10-CM | POA: Insufficient documentation

## 2014-08-19 ENCOUNTER — Telehealth: Payer: Self-pay

## 2014-08-19 NOTE — Telephone Encounter (Signed)
We received a faxed letter from [facility] in [place] .  Jacklynn Lewis. Vertell Limber PA-C addressed the anti-coag issue there for [place].  Patient may stop xarelto 1-2 days before scoping.  Patient will be informed at her pre-visit coming up next week and this letter will be scanned into epic. The #'s to reach them is as follows:  Phone: [phone], Fax# [phone].

## 2014-08-24 ENCOUNTER — Encounter: Payer: Self-pay | Admitting: Hematology & Oncology

## 2014-08-27 ENCOUNTER — Ambulatory Visit (AMBULATORY_SURGERY_CENTER): Payer: Self-pay | Admitting: *Deleted

## 2014-08-27 VITALS — Ht 60.0 in | Wt 221.0 lb

## 2014-08-27 DIAGNOSIS — R197 Diarrhea, unspecified: Secondary | ICD-10-CM

## 2014-08-27 MED ORDER — MOVIPREP 100 G PO SOLR: 1.0000 | Freq: Once | ORAL | Status: DC

## 2014-08-27 NOTE — Progress Notes (Signed)
No egg or soy allergy. No anesthesia problems.  No home O2.  No diet meds.  

## 2014-09-08 ENCOUNTER — Telehealth: Payer: Self-pay | Admitting: Internal Medicine

## 2014-09-08 NOTE — Telephone Encounter (Signed)
No charge. 

## 2014-09-10 ENCOUNTER — Encounter: Payer: Self-pay | Admitting: Internal Medicine

## 2014-09-14 ENCOUNTER — Telehealth: Payer: Self-pay | Admitting: Internal Medicine

## 2014-09-14 NOTE — Telephone Encounter (Signed)
Spoke with patient. She states the Moviprep will be $102, even after using the $10 off coupon. She wants cheaper prep. Miralax split dose prep given to patient and instructions mailed to patient's home address. Pt aware.

## 2014-09-14 NOTE — Telephone Encounter (Signed)
Called patient, no answer at this time. Will try again.

## 2014-09-15 ENCOUNTER — Telehealth: Payer: Self-pay | Admitting: Hematology & Oncology

## 2014-09-15 NOTE — Telephone Encounter (Signed)
Left vm w NEW PATIENT today to remind them of their appointment with Dr. Ennever. Also, advised them to bring all medication bottles and insurance card information. ° °

## 2014-09-16 ENCOUNTER — Ambulatory Visit: Payer: Self-pay | Admitting: Family

## 2014-09-16 ENCOUNTER — Ambulatory Visit: Payer: Self-pay

## 2014-09-16 ENCOUNTER — Other Ambulatory Visit: Payer: Self-pay | Admitting: Lab

## 2014-09-16 ENCOUNTER — Telehealth: Payer: Self-pay | Admitting: Hematology & Oncology

## 2014-09-16 NOTE — Telephone Encounter (Signed)
Patient called and cx 09/16/14 apt due to not feeling well.  She resch for 09/23/14. Rn was notified

## 2014-09-23 ENCOUNTER — Ambulatory Visit (HOSPITAL_BASED_OUTPATIENT_CLINIC_OR_DEPARTMENT_OTHER): Payer: BLUE CROSS/BLUE SHIELD | Admitting: Family

## 2014-09-23 ENCOUNTER — Other Ambulatory Visit (HOSPITAL_BASED_OUTPATIENT_CLINIC_OR_DEPARTMENT_OTHER): Payer: BLUE CROSS/BLUE SHIELD | Admitting: Lab

## 2014-09-23 ENCOUNTER — Encounter: Payer: Self-pay | Admitting: Family

## 2014-09-23 ENCOUNTER — Ambulatory Visit: Payer: BLUE CROSS/BLUE SHIELD

## 2014-09-23 VITALS — BP 110/63 | HR 66 | Temp 98.1°F | Resp 14 | Ht 60.0 in | Wt 223.0 lb

## 2014-09-23 DIAGNOSIS — D6800 Von Willebrand disease, unspecified: Secondary | ICD-10-CM | POA: Insufficient documentation

## 2014-09-23 DIAGNOSIS — R109 Unspecified abdominal pain: Secondary | ICD-10-CM

## 2014-09-23 DIAGNOSIS — R197 Diarrhea, unspecified: Secondary | ICD-10-CM

## 2014-09-23 DIAGNOSIS — D6801 Von willebrand disease, type 1: Secondary | ICD-10-CM

## 2014-09-23 DIAGNOSIS — Z86711 Personal history of pulmonary embolism: Secondary | ICD-10-CM

## 2014-09-23 DIAGNOSIS — Z720 Tobacco use: Secondary | ICD-10-CM

## 2014-09-23 DIAGNOSIS — K5900 Constipation, unspecified: Secondary | ICD-10-CM

## 2014-09-23 DIAGNOSIS — Z7901 Long term (current) use of anticoagulants: Secondary | ICD-10-CM

## 2014-09-23 LAB — COMPREHENSIVE METABOLIC PANEL
ALT: 27 U/L (ref 0–35)
AST: 22 U/L (ref 0–37)
Albumin: 3.8 g/dL (ref 3.5–5.2)
Alkaline Phosphatase: 57 U/L (ref 39–117)
BUN: 16 mg/dL (ref 6–23)
CALCIUM: 9.2 mg/dL (ref 8.4–10.5)
CO2: 26 meq/L (ref 19–32)
CREATININE: 0.65 mg/dL (ref 0.50–1.10)
Chloride: 101 mEq/L (ref 96–112)
Glucose, Bld: 96 mg/dL (ref 70–99)
Potassium: 4.5 mEq/L (ref 3.5–5.3)
Sodium: 139 mEq/L (ref 135–145)
Total Bilirubin: 0.4 mg/dL (ref 0.2–1.2)
Total Protein: 6.6 g/dL (ref 6.0–8.3)

## 2014-09-23 LAB — CBC WITH DIFFERENTIAL (CANCER CENTER ONLY)
BASO#: 0.1 10*3/uL (ref 0.0–0.2)
BASO%: 0.5 % (ref 0.0–2.0)
EOS%: 1.4 % (ref 0.0–7.0)
Eosinophils Absolute: 0.2 10*3/uL (ref 0.0–0.5)
HCT: 43 % (ref 34.8–46.6)
HGB: 14.2 g/dL (ref 11.6–15.9)
LYMPH#: 3 10*3/uL (ref 0.9–3.3)
LYMPH%: 27.5 % (ref 14.0–48.0)
MCH: 29.9 pg (ref 26.0–34.0)
MCHC: 33 g/dL (ref 32.0–36.0)
MCV: 91 fL (ref 81–101)
MONO#: 0.6 10*3/uL (ref 0.1–0.9)
MONO%: 5.2 % (ref 0.0–13.0)
NEUT#: 7 10*3/uL — ABNORMAL HIGH (ref 1.5–6.5)
NEUT%: 65.4 % (ref 39.6–80.0)
Platelets: 354 10*3/uL (ref 145–400)
RBC: 4.75 10*6/uL (ref 3.70–5.32)
RDW: 15.3 % (ref 11.1–15.7)
WBC: 10.7 10*3/uL — ABNORMAL HIGH (ref 3.9–10.0)

## 2014-09-23 MED ORDER — DICYCLOMINE HCL 20 MG PO TABS: 20.0000 mg | ORAL_TABLET | Freq: Two times a day (BID) | ORAL | Status: DC

## 2014-09-23 NOTE — Progress Notes (Signed)
Hematology/Oncology Consultation   Name: Courtney [name]      MRN: [id number]    Location: Room/bed info not found  Date: 09/23/2014 Time:2:14 PM   REFERRING PHYSICIAN:  [place] FOR CONSULT: Von Willebrand's and history of PE on Xarelto    DIAGNOSIS:  1. Von Willebrand's type I 2. History of PE  HISTORY OF PRESENT ILLNESS: Ms. Courtney [name] is a very pleasant 44 yo white female with history of Von Willebrand's disease. She was diagnosed 12 years ago. She also has a history of PE in 2004. She was on Coumadin until October 2015 when she was switched to Xarelto. She has had no episodes of bleeding.  Her mother, grandmother and two daughters also have Von Willebrand's disease.  She has also been anemic in the past but today her Hgb is 14.2.  Her grandfather had colon cancer and her grandmother had breast cancer.  She has had a tubal and a cholecystectomy. She also had an ablation done for heavy cycles with the coumadin. She has regular cycles now lasting only 1 day.  She had an incisional hernia repair in October and developed a hematoma. This has resolved.  She has 4 children and each of her pregnancies was healthy and no issues during delivery.  She is a smoker but doe not drink alcohol.  She has been having a lot of problems with abdominal pain and constipation/diarrhea. Her father and brother have chron's disease. She is having both an endoscopy and colonoscopy on March 3rd.  She denies fever, chills, n/v, cough, rash, dizziness, SOB, chest pain, palpitations, blood in urine or stool.  No swelling, tenderness, numbness or tingling in her extremities.  Her appetite is good and she is drinking plenty of fluids. Her weight is stable.   ROS: All other 10 point review of systems is negative.   PAST MEDICAL HISTORY:   Past Medical History  Diagnosis Date  . PE (pulmonary embolism)     xarelto  . Asthma   . Hypertension     Lisinopril  . Depression   . Anxiety   . Hiatal  hernia     Stable large hiatal hernia with herniation  . Anal fissure 2005  . Anemia 2003  . Colon polyp 2005  . History of blood clots 2003  . Gallstones 1997  . Obesity   . Pneumonia 07/2013  . Paraesophageal hernia 2004    ALLERGIES: Allergies  Allergen Reactions  . Levaquin [Levofloxacin In D5w] Hives      MEDICATIONS:  Current Outpatient Prescriptions on File Prior to Visit  Medication Sig Dispense Refill  . albuterol (PROVENTIL HFA;VENTOLIN HFA) 108 (90 BASE) MCG/ACT inhaler Inhale 2 puffs into the lungs every 6 (six) hours as needed for wheezing or shortness of breath. 1 Inhaler 0  . diazepam (VALIUM) 5 MG tablet Take 1 tablet (5 mg total) by mouth as needed for anxiety. 30 tablet 0  . dicyclomine (BENTYL) 20 MG tablet Take 1 tablet (20 mg total) by mouth 2 (two) times daily. 20 tablet 0  . escitalopram (LEXAPRO) 20 MG tablet Take 1 tablet (20 mg total) by mouth daily. 30 tablet 1  . lisinopril-hydrochlorothiazide (PRINZIDE,ZESTORETIC) 20-12.5 MG per tablet Take 1 tablet by mouth 2 (two) times daily.    Marland Kitchen MOVIPREP 100 G SOLR Take 1 kit (200 g total) by mouth once. Name brand only, movi prep as directed, no substitutions. 1 kit 0  . ondansetron (ZOFRAN ODT) 4 MG disintegrating tablet Take  1 tablet (4 mg total) by mouth every 8 (eight) hours as needed for nausea or vomiting. (Patient not taking: Reported on 08/27/2014) 10 tablet 0  . XARELTO STARTER PACK 15 & 20 MG TBPK Take 15-20 mg by mouth as directed. Take as directed on package: Start with one 30m tablet by mouth twice a day with food. On Day 22, switch to one 262mtablet once a day with food. 51 each 0  . zolpidem (AMBIEN) 10 MG tablet Take 10 mg by mouth at bedtime as needed for sleep.     No current facility-administered medications on file prior to visit.     PAST SURGICAL HISTORY Past Surgical History  Procedure Laterality Date  . Ventral hernia repair N/A 06/13/2014    Procedure: HERNIA REPAIR VENTRAL ADULT;   Surgeon: JaDoreen SalvageMD;  [facility] Service: General;  Laterality: N/A;  . Cholecystectomy    . Hip fracture surgery Left 2010  . Tubal ligation    . Ablation on endometriosis    . Ankle fracture surgery    . [facility]      FAMILY HISTORY: Family History  Problem Relation Age of Onset  . Bipolar disorder Mother   . Bipolar disorder Father   . Depression Brother   . Breast cancer Paternal Grandmother   . Clotting disorder Child     x3; Von Willabrams disease  . Colon cancer Maternal Grandfather   . Colon polyps Neg Hx   . Crohn's disease Brother   . Diabetes Neg Hx   . Kidney disease Neg Hx   . Esophageal cancer Neg Hx   . Gallbladder disease Neg Hx     SOCIAL HISTORY:  reports that she has been smoking Cigarettes.  She has a 5 pack-year smoking history. She has never used smokeless tobacco. She reports that she does not drink alcohol or use illicit drugs.  PERFORMANCE STATUS: The patient's performance status is 0 - Asymptomatic  PHYSICAL EXAM: Most Recent Vital Signs: Last menstrual period 08/22/2014. BP 110/63 mmHg  Pulse 66  Temp(Src) 98.1 F (36.7 C) (Oral)  Resp 14  Ht 5' (1.524 m)  Wt 223 lb (101.152 kg)  BMI 43.55 kg/m2  LMP 08/22/2014  General Appearance:    Alert, cooperative, no distress, appears stated age  Head:    Normocephalic, without obvious abnormality, atraumatic  Eyes:    PERRL, conjunctiva/corneas clear, EOM's intact, fundi    benign, both eyes        Throat:   Lips, mucosa, and tongue normal; teeth and gums normal  Neck:   Supple, symmetrical, trachea midline, no adenopathy;    thyroid:  no enlargement/tenderness/nodules; no carotid   bruit or JVD  Back:     Symmetric, no curvature, ROM normal, no CVA tenderness  Lungs:     Clear to auscultation bilaterally, respirations unlabored  Chest Wall:    No tenderness or deformity   Heart:    Regular rate and rhythm, S1 and S2 normal,  no murmur, rub   or gallop     Abdomen:     Soft, non-tender, bowel sounds active all four quadrants,    no masses, no organomegaly        Extremities:   Extremities normal, atraumatic, no cyanosis or edema  Pulses:   2+ and symmetric all extremities  Skin:   Skin color, texture, turgor normal, no rashes or lesions  Lymph  nodes:   Cervical, supraclavicular, and axillary nodes normal  Neurologic:   CNII-XII intact, normal strength, sensation and reflexes    throughout   LABORATORY DATA:  Results for orders placed or performed in visit on 09/23/14 (from the past 48 hour(s))  CBC with Differential [facility])     Status: Abnormal   Collection Time: 09/23/14  1:50 PM  Result Value Ref Range   WBC 10.7 (H) 3.9 - 10.0 10e3/uL   RBC 4.75 3.70 - 5.32 10e6/uL   HGB 14.2 11.6 - 15.9 g/dL   HCT 43.0 34.8 - 46.6 %   MCV 91 81 - 101 fL   MCH 29.9 26.0 - 34.0 pg   MCHC 33.0 32.0 - 36.0 g/dL   RDW 15.3 11.1 - 15.7 %   Platelets 354 145 - 400 10e3/uL   NEUT# 7.0 (H) 1.5 - 6.5 10e3/uL   LYMPH# 3.0 0.9 - 3.3 10e3/uL   MONO# 0.6 0.1 - 0.9 10e3/uL   Eosinophils Absolute 0.2 0.0 - 0.5 10e3/uL   BASO# 0.1 0.0 - 0.2 10e3/uL   NEUT% 65.4 39.6 - 80.0 %   LYMPH% 27.5 14.0 - 48.0 %   MONO% 5.2 0.0 - 13.0 %   EOS% 1.4 0.0 - 7.0 %   BASO% 0.5 0.0 - 2.0 %      RADIOGRAPHY: No results found.     PATHOLOGY: None   ASSESSMENT/PLAN: Ms. Eagleson is a very pleasant 44 yo white female with history of Von Willebrand's disease. She was diagnosed 12 years ago. She also has a history of PE in 2004. She was on Coumadin until October 2015 when she was switched to Xarelto. She has had no episodes of bleeding.  We will stop her Xarelto.  We will schedule her for a mammogram.  Prescription refill given to her for Bentyl. She is still having a lot of abdominal cramping.  We will continue to follow along with her as needed. She knows to call here with any questions or concerns and to go to the ED in the event  of an emergency.   The patient was discussed with and also seen by Dr. Marin Olp and he is in agreement with the aforementioned.   [facility] M    Addendum:  I saw and examined the patient with Sarah. She has had only one episode of the pulmonary embolism. Even if no etiology was found and this was idiopathic, I still do not believe that 12 years of anticoagulation is needed.  I think that it is safe for her to get off anticoagulation at this point.  I would not put her on aspirin given the phone that she has von Willebrand's disease.  Not sure what all the issues are with her abdomen. She thinks that she may have Crohn's disease. She will have endoscopies.  At this point, I just don't think we have to see her back in the office unless she has a problem.  We spent about 40-45 minutes with her. We reassured her about the fact that she go off the Xarelto. I still do not see any obvious indication for long-term anticoagulation with her.  Laurey Arrow

## 2014-09-23 NOTE — Patient Instructions (Signed)
Smoking Cessation Quitting smoking is important to your health and has many advantages. However, it is not always easy to quit since nicotine is a very addictive drug. Oftentimes, people try 3 times or more before being able to quit. This document explains the best ways for you to prepare to quit smoking. Quitting takes hard work and a lot of effort, but you can do it. ADVANTAGES OF QUITTING SMOKING  You will live longer, feel better, and live better.  Your body will feel the impact of quitting smoking almost immediately.  Within 20 minutes, blood pressure decreases. Your pulse returns to its normal level.  After 8 hours, carbon monoxide levels in the blood return to normal. Your oxygen level increases.  After 24 hours, the chance of having a heart attack starts to decrease. Your breath, hair, and body stop smelling like smoke.  After 48 hours, damaged nerve endings begin to recover. Your sense of taste and smell improve.  After 72 hours, the body is virtually free of nicotine. Your bronchial tubes relax and breathing becomes easier.  After 2 to 12 weeks, lungs can hold more air. Exercise becomes easier and circulation improves.  The risk of having a heart attack, stroke, cancer, or lung disease is greatly reduced.  After 1 year, the risk of coronary heart disease is cut in half.  After 5 years, the risk of stroke falls to the same as a nonsmoker.  After 10 years, the risk of lung cancer is cut in half and the risk of other cancers decreases significantly.  After 15 years, the risk of coronary heart disease drops, usually to the level of a nonsmoker.  If you are pregnant, quitting smoking will improve your chances of having a healthy baby.  The people you live with, especially any children, will be healthier.  You will have extra money to spend on things other than cigarettes. QUESTIONS TO THINK ABOUT BEFORE ATTEMPTING TO QUIT You may want to talk about your answers with your  health care provider.  Why do you want to quit?  If you tried to quit in the past, what helped and what did not?  What will be the most difficult situations for you after you quit? How will you plan to handle them?  Who can help you through the tough times? Your family? Friends? A health care provider?  What pleasures do you get from smoking? What ways can you still get pleasure if you quit? Here are some questions to ask your health care provider:  How can you help me to be successful at quitting?  What medicine do you think would be best for me and how should I take it?  What should I do if I need more help?  What is smoking withdrawal like? How can I get information on withdrawal? GET READY  Set a quit date.  Change your environment by getting rid of all cigarettes, ashtrays, matches, and lighters in your home, car, or work. Do not let people smoke in your home.  Review your past attempts to quit. Think about what worked and what did not. GET SUPPORT AND ENCOURAGEMENT You have a better chance of being successful if you have help. You can get support in many ways.  Tell your family, friends, and coworkers that you are going to quit and need their support. Ask them not to smoke around you.  Get individual, group, or telephone counseling and support. Programs are available at [employer] and health centers. Call  your local health department for information about programs in your area.  Spiritual beliefs and practices may help some smokers quit.  Download a "quit meter" on your computer to keep track of quit statistics, such as how long you have gone without smoking, cigarettes not smoked, and money saved.  Get a self-help book about quitting smoking and staying off tobacco. LEARN NEW SKILLS AND BEHAVIORS  Distract yourself from urges to smoke. Talk to someone, go for a walk, or occupy your time with a task.  Change your normal routine. Take a different route to work.  Drink tea instead of coffee. Eat breakfast in a different place.  Reduce your stress. Take a hot bath, exercise, or read a book.  Plan something enjoyable to do every day. Reward yourself for not smoking.  Explore interactive web-based programs that specialize in helping you quit. GET MEDICINE AND USE IT CORRECTLY Medicines can help you stop smoking and decrease the urge to smoke. Combining medicine with the above behavioral methods and support can greatly increase your chances of successfully quitting smoking.  Nicotine replacement therapy helps deliver nicotine to your body without the negative effects and risks of smoking. Nicotine replacement therapy includes nicotine gum, lozenges, inhalers, nasal sprays, and skin patches. Some may be available over-the-counter and others require a prescription.  Antidepressant medicine helps people abstain from smoking, but how this works is unknown. This medicine is available by prescription.  Nicotinic receptor partial agonist medicine simulates the effect of nicotine in your brain. This medicine is available by prescription. Ask your health care provider for advice about which medicines to use and how to use them based on your health history. Your health care provider will tell you what side effects to look out for if you choose to be on a medicine or therapy. Carefully read the information on the package. Do not use any other product containing nicotine while using a nicotine replacement product.  RELAPSE OR DIFFICULT SITUATIONS Most relapses occur within the first 3 months after quitting. Do not be discouraged if you start smoking again. Remember, most people try several times before finally quitting. You may have symptoms of withdrawal because your body is used to nicotine. You may crave cigarettes, be irritable, feel very hungry, cough often, get headaches, or have difficulty concentrating. The withdrawal symptoms are only temporary. They are strongest  when you first quit, but they will go away within 10-14 days. To reduce the chances of relapse, try to:  Avoid drinking alcohol. Drinking lowers your chances of successfully quitting.  Reduce the amount of caffeine you consume. Once you quit smoking, the amount of caffeine in your body increases and can give you symptoms, such as a rapid heartbeat, sweating, and anxiety.  Avoid smokers because they can make you want to smoke.  Do not let weight gain distract you. Many smokers will gain weight when they quit, usually less than 10 pounds. Eat a healthy diet and stay active. You can always lose the weight gained after you quit.  Find ways to improve your mood other than smoking. FOR MORE INFORMATION  www.smokefree.gov  Document Released: 07/25/2001 Document Revised: 12/15/2013 Document Reviewed: 11/09/2011 [employer] Patient Information 2015 [employer]. This information is not intended to replace advice given to you by your health care provider. Make sure you discuss any questions you have with your health care provider.  

## 2014-09-24 ENCOUNTER — Encounter: Payer: Self-pay | Admitting: Internal Medicine

## 2014-09-24 ENCOUNTER — Telehealth: Payer: Self-pay | Admitting: Hematology & Oncology

## 2014-09-24 NOTE — Telephone Encounter (Signed)
Left pt message to call and schedule mammogram appointment.

## 2014-09-27 LAB — VON WILLEBRAND PANEL
Coagulation Factor VIII: 54 % — ABNORMAL LOW (ref 73–140)
Ristocetin Co-factor, Plasma: 60 % (ref 42–200)
VON WILLEBRAND ANTIGEN, PLASMA: 104 % (ref 50–217)

## 2014-09-27 LAB — APTT: APTT: 32 s (ref 24–37)

## 2014-09-30 ENCOUNTER — Ambulatory Visit (INDEPENDENT_AMBULATORY_CARE_PROVIDER_SITE_OTHER): Payer: Self-pay | Admitting: Psychiatry

## 2014-09-30 ENCOUNTER — Encounter (HOSPITAL_COMMUNITY): Payer: Self-pay | Admitting: Psychiatry

## 2014-09-30 VITALS — BP 123/82 | HR 88 | Ht 60.0 in | Wt 224.0 lb

## 2014-09-30 DIAGNOSIS — F331 Major depressive disorder, recurrent, moderate: Secondary | ICD-10-CM

## 2014-09-30 DIAGNOSIS — F410 Panic disorder [episodic paroxysmal anxiety] without agoraphobia: Secondary | ICD-10-CM

## 2014-09-30 DIAGNOSIS — F4310 Post-traumatic stress disorder, unspecified: Secondary | ICD-10-CM

## 2014-09-30 MED ORDER — ESCITALOPRAM OXALATE 20 MG PO TABS: 20.0000 mg | ORAL_TABLET | Freq: Every day | ORAL | Status: DC

## 2014-09-30 MED ORDER — DIAZEPAM 5 MG PO TABS: 5.0000 mg | ORAL_TABLET | ORAL | Status: DC | PRN

## 2014-09-30 MED ORDER — ZOLPIDEM TARTRATE 10 MG PO TABS: 10.0000 mg | ORAL_TABLET | Freq: Every evening | ORAL | Status: DC | PRN
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
## 2014-10-13 ENCOUNTER — Telehealth: Payer: Self-pay | Admitting: Internal Medicine

## 2014-10-13 MED ORDER — ONDANSETRON 4 MG PO TBDP: 4.0000 mg | ORAL_TABLET | Freq: Four times a day (QID) | ORAL | Status: DC | PRN

## 2014-10-13 NOTE — Telephone Encounter (Signed)
Pt states she is very nauseated and she has thrown up 2 glasses of the prep. Spoke with Nicoletta Ba PA and script for zofran sent to pharmacy and pt knows to take this and try to forge ahead with prep. She will call if she has problems.

## 2014-10-14 ENCOUNTER — Telehealth: Payer: Self-pay | Admitting: Internal Medicine

## 2014-10-14 ENCOUNTER — Encounter: Payer: BLUE CROSS/BLUE SHIELD | Admitting: Internal Medicine

## 2014-10-14 NOTE — Telephone Encounter (Signed)
Spoke with patient.  She was only able to drink a little less than half of prep last night.  She had nausea, vomiting and excruciating pain after drinking the prep.  She doesn't think she will be able to drink 2nd half of prep this morning.  Her stools are all liquid but dark brown.  Please advise.

## 2014-10-14 NOTE — Telephone Encounter (Signed)
Procedures cancelled today per Dr Hilarie Fredrickson.

## 2014-10-15 ENCOUNTER — Encounter (HOSPITAL_COMMUNITY): Payer: Self-pay | Admitting: *Deleted

## 2014-10-19 ENCOUNTER — Encounter (HOSPITAL_COMMUNITY): Payer: Self-pay | Admitting: *Deleted

## 2014-10-19 ENCOUNTER — Observation Stay (HOSPITAL_COMMUNITY)
Admission: RE | Admit: 2014-10-19 | Discharge: 2014-10-20 | Disposition: A | Discharge status: Home / Self Care | Payer: BLUE CROSS/BLUE SHIELD | Source: Ambulatory Visit | Attending: Internal Medicine | Admitting: Internal Medicine

## 2014-10-19 DIAGNOSIS — I10 Essential (primary) hypertension: Secondary | ICD-10-CM | POA: Insufficient documentation

## 2014-10-19 DIAGNOSIS — F419 Anxiety disorder, unspecified: Secondary | ICD-10-CM | POA: Insufficient documentation

## 2014-10-19 DIAGNOSIS — D123 Benign neoplasm of transverse colon: Secondary | ICD-10-CM | POA: Diagnosis not present

## 2014-10-19 DIAGNOSIS — Z86010 Personal history of colonic polyps: Secondary | ICD-10-CM | POA: Insufficient documentation

## 2014-10-19 DIAGNOSIS — R109 Unspecified abdominal pain: Secondary | ICD-10-CM | POA: Diagnosis present

## 2014-10-19 DIAGNOSIS — D120 Benign neoplasm of cecum: Secondary | ICD-10-CM | POA: Diagnosis not present

## 2014-10-19 DIAGNOSIS — F329 Major depressive disorder, single episode, unspecified: Secondary | ICD-10-CM | POA: Insufficient documentation

## 2014-10-19 DIAGNOSIS — F17210 Nicotine dependence, cigarettes, uncomplicated: Secondary | ICD-10-CM | POA: Insufficient documentation

## 2014-10-19 DIAGNOSIS — Z79899 Other long term (current) drug therapy: Secondary | ICD-10-CM | POA: Insufficient documentation

## 2014-10-19 DIAGNOSIS — R112 Nausea with vomiting, unspecified: Secondary | ICD-10-CM

## 2014-10-19 DIAGNOSIS — Z881 Allergy status to other antibiotic agents status: Secondary | ICD-10-CM | POA: Diagnosis not present

## 2014-10-19 DIAGNOSIS — R933 Abnormal findings on diagnostic imaging of other parts of digestive tract: Secondary | ICD-10-CM | POA: Insufficient documentation

## 2014-10-19 DIAGNOSIS — Z86711 Personal history of pulmonary embolism: Secondary | ICD-10-CM | POA: Diagnosis not present

## 2014-10-19 DIAGNOSIS — K449 Diaphragmatic hernia without obstruction or gangrene: Secondary | ICD-10-CM | POA: Diagnosis not present

## 2014-10-19 DIAGNOSIS — Z7901 Long term (current) use of anticoagulants: Secondary | ICD-10-CM | POA: Diagnosis not present

## 2014-10-19 DIAGNOSIS — R1013 Epigastric pain: Secondary | ICD-10-CM

## 2014-10-19 DIAGNOSIS — Z885 Allergy status to narcotic agent status: Secondary | ICD-10-CM | POA: Diagnosis not present

## 2014-10-19 DIAGNOSIS — J45909 Unspecified asthma, uncomplicated: Secondary | ICD-10-CM | POA: Insufficient documentation

## 2014-10-19 DIAGNOSIS — D680 Von Willebrand's disease: Secondary | ICD-10-CM | POA: Diagnosis not present

## 2014-10-19 DIAGNOSIS — R634 Abnormal weight loss: Secondary | ICD-10-CM

## 2014-10-19 DIAGNOSIS — R197 Diarrhea, unspecified: Secondary | ICD-10-CM

## 2014-10-19 LAB — COMPREHENSIVE METABOLIC PANEL
ALBUMIN: 3.7 g/dL (ref 3.5–5.2)
ALK PHOS: 60 U/L (ref 39–117)
ALT: 31 U/L (ref 0–35)
AST: 26 U/L (ref 0–37)
Anion gap: 6 (ref 5–15)
BUN: 13 mg/dL (ref 6–23)
CHLORIDE: 104 mmol/L (ref 96–112)
CO2: 27 mmol/L (ref 19–32)
Calcium: 8.4 mg/dL (ref 8.4–10.5)
Creatinine, Ser: 0.56 mg/dL (ref 0.50–1.10)
GFR calc Af Amer: >90 mL/min (ref >90)
GFR calc non Af Amer: >90 mL/min (ref >90)
Glucose, Bld: 98 mg/dL (ref 70–99)
Potassium: 4.3 mmol/L (ref 3.5–5.1)
Sodium: 137 mmol/L (ref 135–145)
Total Bilirubin: 0.3 mg/dL (ref 0.3–1.2)
Total Protein: 7.1 g/dL (ref 6.0–8.3)

## 2014-10-19 LAB — PROTIME-INR
INR: 0.92 (ref 0.00–1.49)
Prothrombin Time: 12.4 seconds (ref 11.6–15.2)

## 2014-10-19 LAB — CBC
HCT: 41.9 % (ref 36.0–46.0)
Hemoglobin: 13.7 g/dL (ref 12.0–15.0)
MCH: 30.2 pg (ref 26.0–34.0)
MCHC: 32.7 g/dL (ref 30.0–36.0)
MCV: 92.3 fL (ref 78.0–100.0)
Platelets: 329 10*3/uL (ref 150–400)
RBC: 4.54 MIL/uL (ref 3.87–5.11)
RDW: 14.9 % (ref 11.5–15.5)
WBC: 10.1 10*3/uL (ref 4.0–10.5)

## 2014-10-19 MED ORDER — SODIUM CHLORIDE 0.9 % IJ SOLN: 3.0000 mL | Freq: Two times a day (BID) | INTRAMUSCULAR | Status: DC

## 2014-10-19 MED ORDER — HYDROMORPHONE HCL 1 MG/ML IJ SOLN
0.5000 mg | INTRAMUSCULAR | Status: DC | PRN
  Administered 2014-10-19: 0.5 mg via INTRAVENOUS
  Filled 2014-10-19: qty 1

## 2014-10-19 MED ORDER — PEG-KCL-NACL-NASULF-NA ASC-C 100 G PO SOLR
0.5000 | Freq: Once | ORAL | Status: AC
  Administered 2014-10-19: 100 g via ORAL
  Filled 2014-10-19: qty 1

## 2014-10-19 MED ORDER — PEG-KCL-NACL-NASULF-NA ASC-C 100 G PO SOLR: 1.0000 | Freq: Once | ORAL | Status: DC

## 2014-10-19 MED ORDER — ONDANSETRON HCL 4 MG/2ML IJ SOLN
4.0000 mg | Freq: Four times a day (QID) | INTRAMUSCULAR | Status: DC | PRN
  Administered 2014-10-19: 4 mg via INTRAVENOUS
  Filled 2014-10-19: qty 2

## 2014-10-19 MED ORDER — LISINOPRIL 20 MG PO TABS
20.0000 mg | ORAL_TABLET | Freq: Two times a day (BID) | ORAL | Status: DC
  Administered 2014-10-19: 20 mg via ORAL
  Filled 2014-10-19 (×3): qty 1

## 2014-10-19 MED ORDER — SODIUM CHLORIDE 0.9 % IV SOLN: 250.0000 mL | INTRAVENOUS | Status: DC | PRN

## 2014-10-19 MED ORDER — PEG-KCL-NACL-NASULF-NA ASC-C 100 G PO SOLR
0.5000 | Freq: Once | ORAL | Status: AC
  Administered 2014-10-20: 100 g via ORAL

## 2014-10-19 MED ORDER — METOCLOPRAMIDE HCL 5 MG/ML IJ SOLN
5.0000 mg | Freq: Four times a day (QID) | INTRAMUSCULAR | Status: DC
  Filled 2014-10-19: qty 2

## 2014-10-19 MED ORDER — RIFAXIMIN 550 MG PO TABS
550.0000 mg | ORAL_TABLET | Freq: Two times a day (BID) | ORAL | Status: DC
  Administered 2014-10-19: 550 mg via ORAL
  Filled 2014-10-19 (×3): qty 1

## 2014-10-19 MED ORDER — ESCITALOPRAM OXALATE 20 MG PO TABS
20.0000 mg | ORAL_TABLET | Freq: Every day | ORAL | Status: DC
  Filled 2014-10-19: qty 1

## 2014-10-19 MED ORDER — SODIUM CHLORIDE 0.9 % IV SOLN
INTRAVENOUS | Status: DC
  Administered 2014-10-19: 16:00:00 via INTRAVENOUS

## 2014-10-19 MED ORDER — LORAZEPAM 1 MG PO TABS
1.0000 mg | ORAL_TABLET | Freq: Four times a day (QID) | ORAL | Status: DC | PRN
  Administered 2014-10-19 – 2014-10-20 (×2): 1 mg via ORAL
  Filled 2014-10-19 (×2): qty 1

## 2014-10-19 MED ORDER — METOCLOPRAMIDE HCL 5 MG/ML IJ SOLN
10.0000 mg | Freq: Four times a day (QID) | INTRAMUSCULAR | Status: AC
  Administered 2014-10-19 (×2): 10 mg via INTRAVENOUS
  Filled 2014-10-19 (×2): qty 2

## 2014-10-19 MED ORDER — LACTULOSE 10 GM/15ML PO SOLN
30.0000 g | Freq: Two times a day (BID) | ORAL | Status: DC
  Filled 2014-10-19 (×3): qty 45

## 2014-10-19 MED ORDER — LORAZEPAM 0.5 MG PO TABS
0.5000 mg | ORAL_TABLET | Freq: Three times a day (TID) | ORAL | Status: DC | PRN
Start: 2014-10-19 — End: 2014-10-19
  Administered 2014-10-19: 0.5 mg via ORAL
  Filled 2014-10-19: qty 1

## 2014-10-19 MED ORDER — SODIUM CHLORIDE 0.9 % IJ SOLN: 3.0000 mL | INTRAMUSCULAR | Status: DC | PRN

## 2014-10-19 MED ORDER — SODIUM CHLORIDE 0.9 % IV SOLN: INTRAVENOUS | Status: DC

## 2014-10-19 MED ORDER — HYDROMORPHONE HCL 1 MG/ML IJ SOLN
1.0000 mg | INTRAMUSCULAR | Status: DC | PRN
  Administered 2014-10-19: 1 mg via INTRAVENOUS
  Filled 2014-10-19 (×3): qty 1

## 2014-10-19 MED ORDER — ONDANSETRON HCL 4 MG PO TABS: 4.0000 mg | ORAL_TABLET | Freq: Four times a day (QID) | ORAL | Status: DC | PRN

## 2014-10-19 MED ORDER — ALBUTEROL SULFATE (2.5 MG/3ML) 0.083% IN NEBU: 3.0000 mL | INHALATION_SOLUTION | Freq: Four times a day (QID) | RESPIRATORY_TRACT | Status: DC | PRN

## 2014-10-19 MED ORDER — DICYCLOMINE HCL 20 MG PO TABS
20.0000 mg | ORAL_TABLET | Freq: Two times a day (BID) | ORAL | Status: DC
  Administered 2014-10-19: 20 mg via ORAL
  Filled 2014-10-19 (×3): qty 1

## 2014-10-19 MED ORDER — LISINOPRIL-HYDROCHLOROTHIAZIDE 20-12.5 MG PO TABS: 1.0000 | ORAL_TABLET | Freq: Two times a day (BID) | ORAL | Status: DC

## 2014-10-19 MED ORDER — HYDROMORPHONE HCL 2 MG/ML IJ SOLN
2.0000 mg | INTRAMUSCULAR | Status: DC | PRN
  Administered 2014-10-19 – 2014-10-20 (×5): 2 mg via INTRAVENOUS
  Filled 2014-10-19 (×5): qty 1

## 2014-10-19 MED ORDER — HYDROCHLOROTHIAZIDE 12.5 MG PO CAPS
12.5000 mg | ORAL_CAPSULE | Freq: Two times a day (BID) | ORAL | Status: DC
  Administered 2014-10-19: 12.5 mg via ORAL
  Filled 2014-10-19 (×3): qty 1

## 2014-10-19 NOTE — H&P (Signed)
[facility] Admission Note   Primary Care Physician:  Rolland Porter, PA-C Primary Gastroenterologist:  Dr. Hilarie Fredrickson  CHIEF COMPLAINT: Need for support during colonoscopy  HPI: Courtney [name] is a 44 y.o. female who was initially evaluated in the office for evaluation of abdominal pain, nausea and vomiting. Patient apparently has a long-standing history of chronic intermittent loose stools, but since the summer she was having diffuse abdominal pain associated with urges to defecate her stools have been liquid. She also has had nausea and vomiting that come and go but has had an unexplained 60 pound weight loss. She underwent repair of an incarcerated ventral hernia in late October and was subsequently readmitted in November with a large seroma that required drainage and wound VAC. She has intermittently required pain meds since her surgery otherwise she states her pain is horrible. She had also been evaluated for nausea vomiting and abdominal pain in the emergency room in mid December. CBC and comprehensive metabolic panel were normal. And was not done at that visit the patient subsequently had an outpatient surgical follow-up and all of her surgical issues were resolved. Patient was scheduled for an EGD and colonoscopy however when she tried to prep at home she had severe abdominal pain and was unable to tolerate the prep she is admitted today to complete the prep as an inpatient with pain medications and prokinetics to diminish her nausea.   She has a history of von Willebrand's disease that was diagnosed approximately 12 years ago. She had a pulmonary embolism in 2004 and was on Coumadin until October 2015 when she was switched to Xarelto. She has a history of anemia in the past. She reports that her grandfather had colon cancer in her grandmother had breast cancer. She has had a tubal ligation, cholecystectomy, uterine ablation, incisional  hernia repair, drainage of seroma, she states she has a brother with Crohn's disease in her father has Crohn's disease she is very worried that she may have Crohn's disease. She also has a history of asthma, hypertension, depression, anxiety.   Past Medical History  Diagnosis Date  . PE (pulmonary embolism)     xarelto  . Asthma   . Hypertension     Lisinopril  . Depression   . Anxiety   . Hiatal hernia     Stable large hiatal hernia with herniation  . Anal fissure 2005  . Anemia 2003  . Colon polyp 2005  . History of blood clots 2003  . Gallstones 1997  . Obesity   . Pneumonia 07/2013  . Paraesophageal hernia 2004    Past Surgical History  Procedure Laterality Date  . Ventral hernia repair N/A 06/13/2014    Procedure: HERNIA REPAIR VENTRAL ADULT;  Surgeon: Doreen Salvage, MD;  Location: [facility];  Service: General;  Laterality: N/A;  . Cholecystectomy    . Hip fracture surgery Left 2010  . Tubal ligation    . Ablation on endometriosis    . Ankle fracture surgery    . Colonoscopy  2003    [facility]      Prior to Admission medications   Medication Sig Start Date End Date Taking? Authorizing Provider  albuterol (PROVENTIL HFA;VENTOLIN HFA) 108 (90 BASE) MCG/ACT inhaler Inhale 2 puffs into the lungs every 6 (six) hours as needed for wheezing or shortness of breath. 04/27/14  Yes Olam Idler, MD  diazepam (VALIUM) 5 MG tablet Take 1 tablet (5 mg total)  by mouth as needed for anxiety. 09/30/14 09/30/15 Yes Kathlee Nations, MD  dicyclomine (BENTYL) 20 MG tablet Take 1 tablet (20 mg total) by mouth 2 (two) times daily. 09/23/14  Yes Eliezer Bottom, NP  escitalopram (LEXAPRO) 20 MG tablet Take 1 tablet (20 mg total) by mouth daily. 09/30/14  Yes Kathlee Nations, MD  lisinopril-hydrochlorothiazide (PRINZIDE,ZESTORETIC) 20-12.5 MG per tablet Take 1 tablet by mouth 2 (two) times daily.   Yes Historical Provider, MD  ondansetron (ZOFRAN-ODT) 4 MG disintegrating  tablet Take 1 tablet (4 mg total) by mouth every 6 (six) hours as needed for nausea or vomiting. 10/13/14  Yes Jerene Bears, MD  zolpidem (AMBIEN) 10 MG tablet Take 1 tablet (10 mg total) by mouth at bedtime as needed for sleep. 09/30/14  Yes Kathlee Nations, MD    No current facility-administered medications for this encounter.    Allergies as of 10/14/2014 - Review Complete 10/14/2014  Allergen Reaction Noted  . Levaquin [levofloxacin in d5w] Hives 04/27/2014  . Morphine and related Nausea And Vomiting 10/14/2014    Family History  Problem Relation Age of Onset  . Bipolar disorder Mother   . Bipolar disorder Father   . Depression Brother   . Breast cancer Paternal Grandmother   . Clotting disorder Child     x3; Von Willabrams disease  . Colon cancer Maternal Grandfather   . Colon polyps Neg Hx   . Crohn's disease Brother   . Diabetes Neg Hx   . Kidney disease Neg Hx   . Esophageal cancer Neg Hx   . Gallbladder disease Neg Hx     History   Social History  . Marital Status: Divorced    Spouse Name: N/A  . Number of Children: 4  . Years of Education: N/A   Occupational History  . Copy Editor, [employer]    Social History Main Topics  . Smoking status: Current Every Day Smoker -- 0.50 packs/day for 17 years    Types: Cigarettes    Start date: 09/23/1996  . Smokeless tobacco: Never Used     Comment: 09-23-14 STILL SMOKING  . Alcohol Use: No  . Drug Use: No  . Sexual Activity: Not on file   Other Topics Concern  . Not on file   Social History Narrative    REVIEW OF SYSTEMS: Constitutional: No fevr, chills, night sweats ENT:  No nosebleeds, no hearing loss, no sore throat Pulm:  No cough, no SOB CV: no chest pain, non palpitations GU:  No dysuria GI:  no dark stools or bright red blood per rectum. Intermittent nausea. Heme:  No abnormal bleeding or bruising Neuro:  No headazches, dizziness, weakness Derm:  No rashes    PHYSICAL EXAM:  Temp:   [98.5 F (36.9 C)] 98.5 F (36.9 C) (03/07 1300) Pulse Rate:  [88] 88 (03/07 1300) BP: (168)/(89) 168/89 mmHg (03/07 1300) SpO2:  [99 %] 99 % (03/07 1300)   General:  Alert, oriented, appears anxious but in no distress Ears:  External ears normal Mouth:  No lesions, mucosa moist Neck: supple, trachea midline, no adenopathy Lungs:  insp wheeze cleared with cough Heart:  RRR S1S2 Abdomen: soft, nondistended, diffusely tender, no rebound or guarding, + BS   Msk: no gross deformities Extremities:  No c/c/e  Neurologic:Alert and oriented to person, place and time Skin:  Warm, dry, no lesions noted Psych:  Normal mood and affect. Behavior is normal.   LAB RESULTS: CBC 09/23/2014  white blood cell 10.7, hemoglobin 14.2, hematocrit 43, platelets 3 54,000.  RADIOLOGY STUDIES: CLINICAL DATA: Left-sided abdominal and back pain  EXAM: ABDOMEN - 2 VIEW  COMPARISON: 06/29/2014  FINDINGS: There is no free intraperitoneal gas on the upright view. Postcholecystectomy staples. Normal disproportionate dilatation of bowel. No pneumatosis.  IMPRESSION: Nonobstructive bowel gas pattern.   Electronically Signed  By: Maryclare Bean M.D.  On: 07/27/2014 19:58  images for CT Abdomen Pelvis W Contrast     Study Result     CLINICAL DATA: Diffuse abdominal pain with bloody stools today. History of hernia repair 2 weeks ago. Initial encounter.  EXAM: CT ABDOMEN AND PELVIS WITH CONTRAST  TECHNIQUE: Multidetector CT imaging of the abdomen and pelvis was performed using the standard protocol following bolus administration of intravenous contrast.  CONTRAST: 154mL OMNIPAQUE IOHEXOL 300 MG/ML SOLN  COMPARISON: Acute abdominal series done today. Abdominal pelvic CT 06/11/2014.  FINDINGS: Lower chest: Again demonstrated is a large hiatal hernia. The majority of the stomach is herniated into the lower left hemithorax. There is adjacent left lower lobe atelectasis. The  right lung base is clear. There is no significant pleural or pericardial effusion.  Hepatobiliary: Mildly heterogeneous hepatic steatosis appear slightly improved. No focal liver lesions demonstrated. There is no significant biliary dilatation status post cholecystectomy.  Pancreas: Unremarkable. No pancreatic ductal dilatation or surrounding inflammatory changes.  Spleen: Normal in size without focal abnormality.  Adrenals/Urinary Tract: Both adrenal glands appear normal.The kidneys appear normal without evidence of urinary tract calculus or hydronephrosis. No bladder abnormalities are seen.  Stomach/Bowel: The proximal small bowel loops are mildly prominent in caliber, likely related to enteric contrast or a mild ileus. No evidence of bowel wall thickening, significant distention or surrounding inflammatory change.The appendix appears normal. There is no ascites or focal extraluminal fluid collection. No inflammatory changes are demonstrated.  Vascular/Lymphatic: Small mesenteric lymph nodes are not pathologically enlarged. No significant vascular findings are present.  Reproductive: The uterus and ovaries appear normal. There is no adnexal mass.  Other: There has been interval revision of the ventral hernia repair. Associated mesh appears intact. There is a fairly homogeneous fluid collection within the subcutaneous fat of the anterior abdominal wall which measures up to 11.1 x 6.1 cm transverse. No residual hernia is demonstrated.  Musculoskeletal: No acute or significant osseous findings. Previous left femoral ORIF noted.  IMPRESSION: 1. Interval repair of ventral hernia. There is a large fluid collection within the anterior abdominal wall, likely a postoperative seroma. No specific signs of superimposed infection identified. Correlate clinically. 2. Stable large hiatal hernia with herniation of the majority of the stomach into the lower left chest. 3.  Possible minimal small bowel ileus. No evidence of obstruction or intra-abdominal inflammatory process.   Electronically Signed  By: Camie Patience M.D.  On: 06/29/2014 13:42     IMPRESSION/PLAN:  #1. Chronic intermittent abdominal pain and change in bowel habits. These have become worse over the last several months. Severe IBS is suspected however she is to undergo colonoscopy to exclude other etiologies.The risks, benefits, and alternatives to colonoscopy with possible biopsy and possible polypectomy were discussed with the patient and they consent to proceed.   #2. Nausea vomiting and 60 pound weight loss. She does have a large hiatal hernia with the majority of her stomach in the lower left chest on CT scan question if this may be adding to her nausea however the etiology of her nausea is unclear. She will be scheduled for an EGD at the  time of colonoscopy to evaluate for an etiology of her nausea.The risks, benefits, and alternatives to endoscopy with possible biopsy and possible dilation were discussed with the patient and they consent to proceed.   #3 personal history of colon polyps 12 years ago. No records available. Nature of polyps unknown.  #4. Morbid obesity.     Garey Alleva, Vita Barley  10/19/2014, 2:07 PM Pager [phone]

## 2014-10-20 ENCOUNTER — Encounter (HOSPITAL_COMMUNITY)
Admission: RE | Disposition: A | Discharge status: Home / Self Care | Payer: Self-pay | Source: Ambulatory Visit | Attending: Internal Medicine

## 2014-10-20 ENCOUNTER — Observation Stay (HOSPITAL_COMMUNITY): Payer: BLUE CROSS/BLUE SHIELD | Admitting: Anesthesiology

## 2014-10-20 ENCOUNTER — Encounter (HOSPITAL_COMMUNITY): Payer: Self-pay | Admitting: Gastroenterology

## 2014-10-20 DIAGNOSIS — K449 Diaphragmatic hernia without obstruction or gangrene: Secondary | ICD-10-CM | POA: Diagnosis not present

## 2014-10-20 DIAGNOSIS — R1084 Generalized abdominal pain: Secondary | ICD-10-CM

## 2014-10-20 DIAGNOSIS — D123 Benign neoplasm of transverse colon: Secondary | ICD-10-CM | POA: Insufficient documentation

## 2014-10-20 DIAGNOSIS — D120 Benign neoplasm of cecum: Secondary | ICD-10-CM | POA: Insufficient documentation

## 2014-10-20 HISTORY — PX: COLONOSCOPY WITH PROPOFOL: SHX5780

## 2014-10-20 HISTORY — PX: ESOPHAGOGASTRODUODENOSCOPY (EGD) WITH PROPOFOL: SHX5813

## 2014-10-20 SURGERY — ESOPHAGOGASTRODUODENOSCOPY (EGD) WITH PROPOFOL
Anesthesia: Monitor Anesthesia Care

## 2014-10-20 SURGERY — COLONOSCOPY WITH PROPOFOL
Anesthesia: Monitor Anesthesia Care

## 2014-10-20 MED ORDER — MIDAZOLAM HCL 2 MG/2ML IJ SOLN
INTRAMUSCULAR | Status: AC
  Filled 2014-10-20: qty 2

## 2014-10-20 MED ORDER — PROPOFOL 10 MG/ML IV BOLUS
INTRAVENOUS | Status: AC
  Filled 2014-10-20: qty 20

## 2014-10-20 MED ORDER — RIFAXIMIN 550 MG PO TABS: 550.0000 mg | ORAL_TABLET | Freq: Three times a day (TID) | ORAL | Status: AC

## 2014-10-20 MED ORDER — HYDROCODONE-ACETAMINOPHEN 5-325 MG PO TABS: 1.0000 | ORAL_TABLET | Freq: Three times a day (TID) | ORAL | Status: DC | PRN

## 2014-10-20 MED ORDER — ONDANSETRON HCL 4 MG PO TABS: 4.0000 mg | ORAL_TABLET | Freq: Four times a day (QID) | ORAL | Status: DC | PRN

## 2014-10-20 MED ORDER — LACTATED RINGERS IV SOLN
INTRAVENOUS | Status: DC
  Administered 2014-10-20: 1000 mL via INTRAVENOUS

## 2014-10-20 MED ORDER — DICYCLOMINE HCL 20 MG PO TABS: 20.0000 mg | ORAL_TABLET | Freq: Two times a day (BID) | ORAL | Status: DC

## 2014-10-20 MED ORDER — PHENYLEPHRINE 40 MCG/ML (10ML) SYRINGE FOR IV PUSH (FOR BLOOD PRESSURE SUPPORT)
PREFILLED_SYRINGE | INTRAVENOUS | Status: AC
  Filled 2014-10-20: qty 10

## 2014-10-20 MED ORDER — LACTATED RINGERS IV SOLN: INTRAVENOUS | Status: DC

## 2014-10-20 MED ORDER — BUTAMBEN-TETRACAINE-BENZOCAINE 2-2-14 % EX AERO
INHALATION_SPRAY | CUTANEOUS | Status: DC | PRN
  Administered 2014-10-20: 2 via TOPICAL

## 2014-10-20 MED ORDER — MIDAZOLAM HCL 5 MG/5ML IJ SOLN
INTRAMUSCULAR | Status: DC | PRN
  Administered 2014-10-20 (×2): 2 mg via INTRAVENOUS

## 2014-10-20 MED ORDER — PROPOFOL INFUSION 10 MG/ML OPTIME
INTRAVENOUS | Status: DC | PRN
  Administered 2014-10-20: 80 ug/kg/min via INTRAVENOUS

## 2014-10-20 MED ORDER — RIFAXIMIN 550 MG PO TABS: 550.0000 mg | ORAL_TABLET | Freq: Two times a day (BID) | ORAL | Status: DC

## 2014-10-20 MED ORDER — LACTULOSE 10 GM/15ML PO SOLN: 30.0000 g | Freq: Two times a day (BID) | ORAL | Status: DC

## 2014-10-20 MED ORDER — PHENYLEPHRINE HCL 10 MG/ML IJ SOLN
INTRAMUSCULAR | Status: DC | PRN
  Administered 2014-10-20: 80 ug via INTRAVENOUS

## 2014-10-20 SURGICAL SUPPLY — 24 items

## 2014-10-20 NOTE — Op Note (Signed)
[facility] [place], [address]   COLONOSCOPY PROCEDURE REPORT  PATIENT: Courtney [name], Courtney [name]  MR#: [id number] BIRTHDATE: [birth year] , 43  yrs. old GENDER: female ENDOSCOPIST: Jerene Bears, MD PROCEDURE DATE:  10/20/2014 PROCEDURE:   Colonoscopy with snare polypectomy and Colonoscopy with biopsy First Screening Colonoscopy - Avg.  risk and is 50 yrs.  old or older - No.  Prior Negative Screening - Now for repeat screening. N/A  History of Adenoma - Now for follow-up colonoscopy & has been > or = to 3 yrs.  N/A  Polyps Removed Today? Yes. ASA CLASS:   Class III INDICATIONS:abdominal pain, weight loss, and chronic diarrhea. MEDICATIONS: Monitored anesthesia care and Per Anesthesia  DESCRIPTION OF PROCEDURE:   After the risks benefits and alternatives of the procedure were thoroughly explained, informed consent was obtained.  The digital rectal exam revealed hemorrhoids.   The Pentax Ped Colon [id number]  endoscope was introduced through the anus and advanced to the terminal ileum which was intubated for a short distance. No adverse events experienced.   The quality of the prep was good.  (MoviPrep was used)  The instrument was then slowly withdrawn as the colon was fully examined.   COLON FINDINGS: The examined terminal ileum appeared to be normal. Four sessile polyps ranging from 4 to 31mm in size were found in the transverse colon (2) and at the cecum (2).  Polypectomies were performed with cold forceps (1 cecum) and with a cold snare (3). The resection was complete, the polyp tissue was completely retrieved and sent to histology.   The colonic mucosa appeared otherwise normal throughout the entire examined colon.  Multiple random biopsies were performed using cold forceps.  Samples were sent to R/O microscopic colitis.  Retroflexed views revealed no abnormalities. The time to cecum = 3 Withdrawal time = 21   The scope was withdrawn and the procedure  completed. COMPLICATIONS: There were no immediate complications.  ENDOSCOPIC IMPRESSION: 1.   The examined terminal ileum appeared to be normal 2.   Four sessile polyps ranging from 4 to 74mm in size were found in the transverse colon and at the cecum; polypectomies were performed with cold forceps and with a cold snare 3.   The colonic mucosa appeared otherwise normal throughout the entire examined colon; multiple random biopsies were performed using cold forceps  RECOMMENDATIONS: 1.  Await pathology results 2.  Timing of repeat colonoscopy will be determined by pathology findings. 3.  You will receive a letter within 1-2 weeks with the results of your biopsy as well as final recommendations.  Please call my office if you have not received a letter after 3 weeks.  eSigned:  Jerene Bears, MD 10/20/2014 10:41 AM   cc: the patient, PCP   PATIENT NAME:  [name], [name] MR#: [id number]

## 2014-10-20 NOTE — Op Note (Signed)
[facility] [place], [address]   ENDOSCOPY PROCEDURE REPORT  PATIENT: Courtney, [name]  MR#: [id number] BIRTHDATE: [birth year] , 43  yrs. old GENDER: female ENDOSCOPIST: Jerene Bears, MD PROCEDURE DATE:  10/20/2014 PROCEDURE:  EGD, diagnostic ASA CLASS:     Class III INDICATIONS:  abdominal pain, nausea, and vomiting. MEDICATIONS: Monitored anesthesia care and Per Anesthesia TOPICAL ANESTHETIC: Cetacaine Spray  DESCRIPTION OF PROCEDURE: After the risks benefits and alternatives of the procedure were thoroughly explained, informed consent was obtained.  The [id number] ( [id number] ) endoscope was introduced through the mouth and advanced to the second portion of the duodenum , Without limitations.  The instrument was slowly withdrawn as the mucosa was fully examined.  ESOPHAGUS: The mucosa of the esophagus appeared normal, though the esophagus is foreshortened and tortuous in the distal third by large hiatal hernia.  No stricture or ring evident.  STOMACH: A large hiatal hernia was noted which significantly distorts gastric anatomy. There was retained fluid in the hiatal hernia sac.  The majority of the stomach is above the diaphragmatic hiatus.  Access to the distal stomach was difficult due to looping in the large hiatal hernia sac but was achieved with epigastric counterpressure. The mucosa of the stomach was unremarkable.  DUODENUM: The duodenal mucosa showed no abnormalities.  Retroflexed views revealed as previously described.     The scope was then withdrawn from the patient and the procedure completed.  COMPLICATIONS: There were no immediate complications.  ENDOSCOPIC IMPRESSION: 1.   The mucosa of the esophagus appeared normal. Esophagus foreshortened and tortuous in the distal third by large hiatal hernia 2.   Large hiatal hernia with retained fluid. The hernia significant early distorts gastric anatomy with the majority of stomach  above diaphragmatic hiatus 3.   The duodenal mucosa showed no abnormalities  RECOMMENDATIONS: 1.  Surgical referral for consideration of hiatal hernia repair (in anticipation of this appointment patient will need upper GI series and esophageal manometry) 2.  Low residue diet 3.  Proceed with a Colonoscopy.  eSigned:  Jerene Bears, MD 10/20/2014 10:34 AM     CC: the patient, PCP

## 2014-10-20 NOTE — Anesthesia Preprocedure Evaluation (Signed)
Anesthesia Evaluation  Patient identified by MRN, date of birth, ID band Patient awake    Reviewed: Allergy & Precautions, NPO status , Patient's Chart, lab work & pertinent test results  History of Anesthesia Complications (+) PONV  Airway Mallampati: II  TM Distance: >3 FB Neck ROM: Full    Dental no notable dental hx.    Pulmonary neg pulmonary ROS, asthma , Current Smoker, PE breath sounds clear to auscultation  Pulmonary exam normal       Cardiovascular hypertension, Pt. on medications Rhythm:Regular Rate:Normal     Neuro/Psych negative neurological ROS  negative psych ROS   GI/Hepatic Neg liver ROS, hiatal hernia,   Endo/Other  Morbid obesity  Renal/GU negative Renal ROS  negative genitourinary   Musculoskeletal negative musculoskeletal ROS (+)   Abdominal   Peds negative pediatric ROS (+)  Hematology negative hematology ROS (+)   Anesthesia Other Findings   Reproductive/Obstetrics negative OB ROS                             Anesthesia Physical Anesthesia Plan  ASA: III  Anesthesia Plan: MAC   Post-op Pain Management:    Induction:   Airway Management Planned:   Additional Equipment:   Intra-op Plan:   Post-operative Plan:   Informed Consent: I have reviewed the patients History and Physical, chart, labs and discussed the procedure including the risks, benefits and alternatives for the proposed anesthesia with the patient or authorized representative who has indicated his/her understanding and acceptance.   Dental advisory given  Plan Discussed with: CRNA  Anesthesia Plan Comments:         Anesthesia Quick Evaluation

## 2014-10-20 NOTE — Discharge Instructions (Signed)
Please adhere to a low residue diet.If you need additional pain medication, contact your primary care MD. Be aware pain meds (vicodin) can cause drowsiness or dizziness, and avoid use of alcohol or avoid operating machinery or driving while on these medications.

## 2014-10-20 NOTE — Anesthesia Postprocedure Evaluation (Signed)
  Anesthesia Post-op Note  Patient: Courtney [name]  Procedure(s) Performed: Procedure(s) (LRB): ESOPHAGOGASTRODUODENOSCOPY (EGD) WITH PROPOFOL (N/A) COLONOSCOPY WITH PROPOFOL (N/A)  Patient Location: PACU  Anesthesia Type: MAC  Level of Consciousness: awake and alert   Airway and Oxygen Therapy: Patient Spontanous Breathing  Post-op Pain: mild  Post-op Assessment: Post-op Vital signs reviewed, Patient's Cardiovascular Status Stable, Respiratory Function Stable, Patent Airway and No signs of Nausea or vomiting  Last Vitals:  Filed Vitals:   10/20/14 1121  BP: 126/81  [name]: 88  Temp: 37.1 C  Resp: 20    Post-op Vital Signs: stable   Complications: No apparent anesthesia complications

## 2014-10-20 NOTE — Transfer of Care (Signed)
Immediate Anesthesia Transfer of Care Note  Patient: Courtney [name]  Procedure(s) Performed: Procedure(s): ESOPHAGOGASTRODUODENOSCOPY (EGD) WITH PROPOFOL (N/A) COLONOSCOPY WITH PROPOFOL (N/A)  Patient Location: PACU  Anesthesia Type:MAC  Level of Consciousness: sedated  Airway & Oxygen Therapy: Patient Spontanous Breathing and Patient connected to face mask oxygen  Post-op Assessment: Report given to RN and Post -op Vital signs reviewed and stable  Post vital signs: Reviewed and stable  Last Vitals:  Filed Vitals:   10/20/14 0850  BP: 102/59  [name]: 87  Temp: 36.9 C  Resp: 14    Complications: No apparent anesthesia complications

## 2014-10-20 NOTE — Discharge Summary (Signed)
[facility] Discharge Summary  Name: Courtney [name] MRN: [id number] DOB: 01-Apr-1971 44 y.o. PCP:  Rolland Porter, PA-C  Date of Admission: 10/19/2014  1:52 PM Date of Discharge: 10/20/2014 Primary Gastroenterologist: Discharging Physician:  Discharge Diagnosis: Active Problems:   Abdominal pain   Frequent loose stools   Abnormal finding on GI tract imaging   Benign neoplasm of cecum   Benign neoplasm of transverse colon   Hiatal hernia   Consultations:none  Procedures Performed: none  GI Procedures: 10/20/14 colonoscopy and EGD ENDOSCOPIC IMPRESSION: 1. The examined terminal ileum appeared to be normal 2. Four sessile polyps ranging from 4 to 33mm in size were found in the transverse colon and at the cecum; polypectomies were performed with cold forceps and with a cold snare 3. The colonic mucosa appeared otherwise normal throughout the entire examined colon; multiple random biopsies were performed using cold forceps RECOMMENDATIONS: 1. Await pathology results 2. Timing of repeat colonoscopy will be determined by pathology findings. 3. You will receive a letter within 1-2 weeks with the results of your biopsy as well as final recommendations. Please call my office if you have not received a letter after 3 weeks.  ENDOSCOPIC IMPRESSION: 1. The mucosa of the esophagus appeared normal. Esophagus foreshortened and tortuous in the distal third by large hiatal hernia 2. Large hiatal hernia with retained fluid. The hernia significant early distorts gastric anatomy with the majority of stomach above diaphragmatic hiatus 3. The duodenal mucosa showed no abnormalities RECOMMENDATIONS: 1. Surgical referral for consideration of hiatal hernia repair (in anticipation of this appointment patient will need upper GI series and esophageal manometry) 2. Low residue diet 3. Proceed with a Colonoscopy. eSigned: Jerene Bears, MD 10/20/2014 10:34 AM CC: the patient,  PCP   History/Physical Exam:  See Admission H&P  Admission HPI: Pt admitted for observation for ongoing upper abd pain, nausea, change in bowel habits with urgent loose stools. Also with unexplained weight loss of >20 lbs  Hospital Course: Pt was admitted for IV hydration, pain control, antiemetics while carrying out bowel prep.  Pt tolerated prep and had colonoscopy amd EGD,(see above results). Pt felt well several hours afteer procedure and tolerated soft diet. Pt to be discharged home om xifaxan 550 mg bid x 14 days, bentyl 20 mg bid. Pt also given prescription for vicodin 5/325 1 tab po every 8 hours as needed, #10 with no refill.    Discharge Vitals:  BP 112/64 mmHg  Pulse 79  Temp(Src) 97.3 F (36.3 C) (Oral)  Resp 20  Ht 5\' 1"  (1.549 m)  Wt 228 lb (103.42 kg)  BMI 43.10 kg/m2  SpO2 91%  LMP 10/18/2014  Physical Exam  General:a;ert, oriented x 3, NADCardiac: Pulmonary:lungs clear Abdominal:soft, NT + BS Extremity:no C/C/E Neuro:Alert, oriented x 3   Discharge Labs:  Results for orders placed or performed during the hospital encounter of 10/19/14 (from the past 24 hour(s))  Comprehensive metabolic panel     Status: None   Collection Time: 10/19/14  2:50 PM  Result Value Ref Range   Sodium 137 135 - 145 mmol/L   Potassium 4.3 3.5 - 5.1 mmol/L   Chloride 104 96 - 112 mmol/L   CO2 27 19 - 32 mmol/L   Glucose, Bld 98 70 - 99 mg/dL   BUN 13 6 - 23 mg/dL   Creatinine, Ser 0.56 0.50 - 1.10 mg/dL   Calcium 8.4 8.4 - 10.5 mg/dL   Total Protein 7.1 6.0 - 8.3 g/dL  Albumin 3.7 3.5 - 5.2 g/dL   AST 26 0 - 37 U/L   ALT 31 0 - 35 U/L   Alkaline Phosphatase 60 39 - 117 U/L   Total Bilirubin 0.3 0.3 - 1.2 mg/dL   GFR calc non Af Amer >90 >90 mL/min   GFR calc Af Amer >90 >90 mL/min   Anion gap 6 5 - 15  CBC     Status: None   Collection Time: 10/19/14  2:50 PM  Result Value Ref Range   WBC 10.1 4.0 - 10.5 K/uL   RBC 4.54 3.87 - 5.11 MIL/uL   Hemoglobin 13.7 12.0 - 15.0  g/dL   HCT 41.9 36.0 - 46.0 %   MCV 92.3 78.0 - 100.0 fL   MCH 30.2 26.0 - 34.0 pg   MCHC 32.7 30.0 - 36.0 g/dL   RDW 14.9 11.5 - 15.5 %   Platelets 329 150 - 400 K/uL  Protime-INR     Status: None   Collection Time: 10/19/14  2:50 PM  Result Value Ref Range   Prothrombin Time 12.4 11.6 - 15.2 seconds   INR 0.92 0.00 - 1.49    Disposition and follow-up:   Ms.Courtney [name] was discharged from [facility] in stable condition.    Follow-up Appointments: Pt will be called by GI office with follow up appt in GI as well as an appt to see surgery  Discharge Medications:   Medication List    ASK your doctor about these medications        albuterol 108 (90 BASE) MCG/ACT inhaler  Commonly known as:  PROVENTIL HFA;VENTOLIN HFA  Inhale 2 puffs into the lungs every 6 (six) hours as needed for wheezing or shortness of breath.     diazepam 5 MG tablet  Commonly known as:  VALIUM  Take 1 tablet (5 mg total) by mouth as needed for anxiety.     dicyclomine 20 MG tablet  Commonly known as:  BENTYL  Take 1 tablet (20 mg total) by mouth 2 (two) times daily.     escitalopram 20 MG tablet  Commonly known as:  LEXAPRO  Take 1 tablet (20 mg total) by mouth daily.     ibuprofen 200 MG tablet  Commonly known as:  ADVIL,MOTRIN  Take 400 mg by mouth every 6 (six) hours as needed for fever, headache, mild pain, moderate pain or cramping.     lisinopril-hydrochlorothiazide 20-12.5 MG per tablet  Commonly known as:  PRINZIDE,ZESTORETIC  Take 1 tablet by mouth 2 (two) times daily.     ondansetron 4 MG disintegrating tablet  Commonly known as:  ZOFRAN-ODT  Take 1 tablet (4 mg total) by mouth every 6 (six) hours as needed for nausea or vomiting.     zolpidem 10 MG tablet  Commonly known as:  AMBIEN  Take 1 tablet (10 mg total) by mouth at bedtime as needed for sleep.        Signed: Saoirse Legere, Vita Barley PA-C 10/20/2014, 2:19 PM

## 2014-10-20 NOTE — Progress Notes (Signed)
UR completed 

## 2014-10-20 NOTE — Progress Notes (Signed)
     [facility] Progress Note  Subjective:  Tol prep last night. Voices anger over running out of toilet paper and having to use paper towels.      Objective:  Vital signs in last 24 hours: Temp:  [97.7 F (36.5 C)-98.5 F (36.9 C)] 97.7 F (36.5 C) (03/08 0512) Pulse Rate:  [69-105] 105 (03/08 0512) Resp:  [18] 18 (03/08 0512) BP: (96-168)/(67-89) 96/72 mmHg (03/08 0512) SpO2:  [85 %-99 %] 85 % (03/08 0512) Weight:  [228 lb 3.2 oz (103.511 kg)] 228 lb 3.2 oz (103.511 kg) (03/08 0459) Last BM Date: 10/19/14 General:   Alert,  Well-developed,    in NAD Heart:  Regular rate and rhythm; no murmurs Pulm;lungs clear Abdomen:  Soft, mild diffuse tenderness, nondistended. Normal bowel sounds, without guarding, and without rebound.   Extremities:  Without edema. Neurologic:  Alert and  oriented x4;  grossly normal neurologically. Psych:  Alert and cooperative. Normal mood and affect.      Lab Results:  Recent Labs  10/19/14 1450  WBC 10.1  HGB 13.7  HCT 41.9  PLT 329   BMET  Recent Labs  10/19/14 1450  NA 137  K 4.3  CL 104  CO2 27  GLUCOSE 98  BUN 13  CREATININE 0.56  CALCIUM 8.4   LFT  Recent Labs  10/19/14 1450  PROT 7.1  ALBUMIN 3.7  AST 26  ALT 31  ALKPHOS 60  BILITOT 0.3   PT/INR  Recent Labs  10/19/14 1450  LABPROT 12.4  INR 0.92     ASSESSMENT/PLAN:   44 yo female with abd pain, nausea, change in bowel habits and wt loss. For colon/egd today, likely home this pm.     Alaisa Moffitt, Vita Barley PA-C 10/20/2014, Pager [phone]

## 2014-10-20 NOTE — Progress Notes (Signed)
Nutrition Brief Note  Patient identified on the Malnutrition Screening Tool (MST) Report  Pt reports eating normally PTA despite abdominal pain. Pt eating well after colonoscopy today. Pt with minimal weight loss, wt stable since October.   Wt Readings from Last 15 Encounters:  10/20/14 228 lb (103.42 kg)  09/30/14 224 lb (101.606 kg)  09/23/14 223 lb (101.152 kg)  08/27/14 221 lb (100.245 kg)  08/06/14 223 lb 6 oz (101.322 kg)  07/29/14 225 lb 3.2 oz (102.15 kg)  07/27/14 222 lb 4 oz (100.812 kg)  06/29/14 220 lb (99.791 kg)  06/24/14 229 lb 6.4 oz (104.055 kg)  06/13/14 230 lb (104.327 kg)  04/27/14 236 lb 12.8 oz (107.412 kg)    Body mass index is 43.1 kg/(m^2). Patient meets criteria for morbid obesity based on current BMI.   Current diet order is soft, patient is consuming approximately 100% of meals at this time. Labs and medications reviewed.   No nutrition interventions warranted at this time. If nutrition issues arise, please consult RD.   Clayton Bibles, MS, RD, LDN Pager: [phone] After Hours Pager: [phone]

## 2014-10-21 ENCOUNTER — Encounter (HOSPITAL_COMMUNITY): Payer: Self-pay | Admitting: Internal Medicine

## 2014-10-22 ENCOUNTER — Telehealth: Payer: Self-pay | Admitting: Internal Medicine

## 2014-10-22 ENCOUNTER — Encounter: Payer: Self-pay | Admitting: Internal Medicine

## 2014-10-22 NOTE — Telephone Encounter (Signed)
Spoke with pt and discussed with pt her path results. Questions answered.

## 2014-10-23 ENCOUNTER — Other Ambulatory Visit: Payer: Self-pay

## 2014-10-23 ENCOUNTER — Telehealth: Payer: Self-pay

## 2014-10-23 DIAGNOSIS — K219 Gastro-esophageal reflux disease without esophagitis: Secondary | ICD-10-CM

## 2014-10-23 NOTE — Telephone Encounter (Signed)
Spoke with pt and let her know she needed to call [facility] and address the balance on her acct. Asked pt to call back once this is done so we can proceed with other appts. Pt verbalized understanding.

## 2014-10-23 NOTE — Telephone Encounter (Signed)
Pt schedule for EM with ph probe at [facility] 11/16/14@12 :30pm, pt to arrive there at 12noon. Pt to be NPO after midnight. Pt scheduled for Upper gi series at [facility] 10/28/14@11am , pt to arrive at 10:45am. Pt to be NPO after 8am. Called to schedule appt at [facility] and per [facility] pt has balance on acct and must call their office to address balance before they will schedule appt. Called and left message for pt to call back.

## 2014-10-23 NOTE — Telephone Encounter (Signed)
-----   Message from Vita Barley Hvozdovic, PA-C sent at 10/20/2014  3:32 PM EST ----- Vaughan Basta, Dr Hilarie Fredrickson is waiting on path report--but this pt will need f/u with him in 2-3 weeks and will need appt to see her surgeon for hiatal hernia repain.

## 2014-10-28 ENCOUNTER — Ambulatory Visit (HOSPITAL_COMMUNITY): Admission: RE | Admit: 2014-10-28 | Payer: BLUE CROSS/BLUE SHIELD | Source: Ambulatory Visit

## 2014-11-05 NOTE — Telephone Encounter (Signed)
Left message for pt to call back. Pt may need referral to [facility] or [facility] if cannot be seen by [facility].

## 2014-11-16 ENCOUNTER — Encounter (HOSPITAL_COMMUNITY): Admission: RE | Payer: Self-pay | Source: Ambulatory Visit

## 2014-11-16 ENCOUNTER — Ambulatory Visit (HOSPITAL_COMMUNITY)
Admission: RE | Admit: 2014-11-16 | Payer: BLUE CROSS/BLUE SHIELD | Source: Ambulatory Visit | Admitting: Internal Medicine

## 2014-11-16 SURGERY — MANOMETRY, ESOPHAGUS

## 2014-12-30 ENCOUNTER — Ambulatory Visit (HOSPITAL_COMMUNITY): Payer: Self-pay | Admitting: Psychiatry

## 2015-01-06 ENCOUNTER — Telehealth (HOSPITAL_COMMUNITY): Payer: Self-pay | Admitting: *Deleted

## 2015-01-06 DIAGNOSIS — F331 Major depressive disorder, recurrent, moderate: Secondary | ICD-10-CM

## 2015-01-06 MED ORDER — ESCITALOPRAM OXALATE 20 MG PO TABS: 20.0000 mg | ORAL_TABLET | Freq: Every day | ORAL | Status: DC

## 2015-01-06 NOTE — Telephone Encounter (Signed)
Patient called needing refill on Lexapro.  Patient last seen 09-30-2014. Patient no showed appointment on 12-30-2014. Patient scheduled a new appointment for 02-01-15. Is it okay to refill?

## 2015-02-01 ENCOUNTER — Telehealth (HOSPITAL_COMMUNITY): Payer: Self-pay

## 2015-02-01 ENCOUNTER — Encounter (HOSPITAL_COMMUNITY): Payer: Self-pay | Admitting: Psychiatry

## 2015-02-01 ENCOUNTER — Ambulatory Visit (INDEPENDENT_AMBULATORY_CARE_PROVIDER_SITE_OTHER): Payer: Self-pay | Admitting: Psychiatry

## 2015-02-01 VITALS — BP 138/98 | HR 94 | Ht 60.0 in | Wt 228.2 lb

## 2015-02-01 DIAGNOSIS — R0602 Shortness of breath: Secondary | ICD-10-CM

## 2015-02-01 DIAGNOSIS — F4310 Post-traumatic stress disorder, unspecified: Secondary | ICD-10-CM

## 2015-02-01 DIAGNOSIS — R52 Pain, unspecified: Secondary | ICD-10-CM

## 2015-02-01 DIAGNOSIS — F3130 Bipolar disorder, current episode depressed, mild or moderate severity, unspecified: Secondary | ICD-10-CM

## 2015-02-01 DIAGNOSIS — F331 Major depressive disorder, recurrent, moderate: Secondary | ICD-10-CM

## 2015-02-01 DIAGNOSIS — F410 Panic disorder [episodic paroxysmal anxiety] without agoraphobia: Secondary | ICD-10-CM

## 2015-02-01 MED ORDER — ZOLPIDEM TARTRATE 10 MG PO TABS: 10.0000 mg | ORAL_TABLET | Freq: Every evening | ORAL | Status: DC | PRN

## 2015-02-01 MED ORDER — DIAZEPAM 5 MG PO TABS: 5.0000 mg | ORAL_TABLET | ORAL | Status: DC | PRN

## 2015-02-01 MED ORDER — ESCITALOPRAM OXALATE 20 MG PO TABS: 20.0000 mg | ORAL_TABLET | Freq: Every day | ORAL | Status: DC

## 2015-02-01 MED ORDER — ZOLPIDEM TARTRATE 10 MG PO TABS: 10.0000 mg | ORAL_TABLET | Freq: Every evening | ORAL | Status: AC | PRN

## 2015-02-01 NOTE — Telephone Encounter (Signed)
02/01/15 4:46pm After the process of giving the patient her next office appointment the patient stated to me Sunday Spillers) "I feel dizzy" I left my desk and pulled-up a chair from the lobby for the patient to sit - then I alerted Shawn ([place]) that the patient was having a problem - Shawn came over to do an assessment and ask for me Sunday Spillers) to alert Dr. Adele Schilder to come and see the patient.  During the assessment the patient became very nausea and her face was red and she started trembling - Shawn with patient the entire time - the patient requested that we call her employer because she had to report to work - I Sunday Spillers) called her employer and informed them that she would not be at work today.  During that time the patient stared complaining of shortness of breath, tightness in her chest - Shawn informed me to called a [employer] and 911 was also called - the [facility] team came as well as EMS - Shawn Community education officer) still at the patient's side. During all of this the patient did state to [place] and EMS that she had a Pulmonary Embolism 10 years ago.Marland KitchenMariana Kaufman
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
## 2015-04-22 ENCOUNTER — Telehealth (HOSPITAL_COMMUNITY): Payer: Self-pay | Admitting: Psychiatry

## 2015-04-22 ENCOUNTER — Telehealth (HOSPITAL_COMMUNITY): Payer: Self-pay

## 2015-04-22 NOTE — Telephone Encounter (Signed)
I returned patient's phone call.  She is complaining of increased anxiety and feeling overwhelmed.  She missed a few days at work because her car broke down and now she is feeling very nervous anxious and decided to have passive and fleeting suicidal thoughts.  She denies any plan or any intent.  However she wants to see this Probation officer or to be evaluated tomorrow morning.  I ask if she wants to come now but she reported that she is without her car and she does not feel it is that important however she wants to come tomorrow for evaluation.  I explained given the history that she has PE, if symptoms started to get worse and she feels more anxious nervous and having shortness of breath then she should not wait and call 911 immediately.  Patient acknowledged and promised that she will call 911 if symptoms started to get worse or if she has any active suicidal thoughts.

## 2015-04-22 NOTE — Telephone Encounter (Signed)
See telephone notes

## 2015-04-23 ENCOUNTER — Ambulatory Visit (INDEPENDENT_AMBULATORY_CARE_PROVIDER_SITE_OTHER): Payer: Self-pay | Admitting: Psychiatry

## 2015-04-23 ENCOUNTER — Encounter (HOSPITAL_COMMUNITY): Payer: Self-pay | Admitting: Psychiatry

## 2015-04-23 VITALS — BP 115/110 | HR 100 | Ht 61.0 in | Wt 238.0 lb

## 2015-04-23 DIAGNOSIS — F3130 Bipolar disorder, current episode depressed, mild or moderate severity, unspecified: Secondary | ICD-10-CM

## 2015-04-23 DIAGNOSIS — F411 Generalized anxiety disorder: Secondary | ICD-10-CM

## 2015-04-23 DIAGNOSIS — F331 Major depressive disorder, recurrent, moderate: Secondary | ICD-10-CM

## 2015-04-23 MED ORDER — DIAZEPAM 5 MG PO TABS: 5.0000 mg | ORAL_TABLET | ORAL | Status: DC | PRN
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
## 2015-04-26 ENCOUNTER — Encounter (HOSPITAL_COMMUNITY): Payer: Self-pay | Admitting: Psychiatry

## 2015-04-26 ENCOUNTER — Other Ambulatory Visit (HOSPITAL_COMMUNITY): Payer: BLUE CROSS/BLUE SHIELD | Admitting: Psychiatry

## 2015-04-26 DIAGNOSIS — I10 Essential (primary) hypertension: Secondary | ICD-10-CM | POA: Diagnosis not present

## 2015-04-26 DIAGNOSIS — F17210 Nicotine dependence, cigarettes, uncomplicated: Secondary | ICD-10-CM | POA: Insufficient documentation

## 2015-04-26 DIAGNOSIS — F410 Panic disorder [episodic paroxysmal anxiety] without agoraphobia: Secondary | ICD-10-CM

## 2015-04-26 DIAGNOSIS — F331 Major depressive disorder, recurrent, moderate: Secondary | ICD-10-CM

## 2015-04-26 DIAGNOSIS — F411 Generalized anxiety disorder: Secondary | ICD-10-CM | POA: Insufficient documentation

## 2015-04-26 DIAGNOSIS — Z86711 Personal history of pulmonary embolism: Secondary | ICD-10-CM | POA: Insufficient documentation
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
## 2015-04-26 NOTE — Progress Notes (Addendum)
Patient ID: Courtney [name], female   DOB: 02-Jul-1971, 44 y.o.   MRN: [id number] Patient called back on 04/23/2015 in the afternoon stating that she would like to start IOP. She has been scheduled to start on Monday, 04/26/15

## 2015-04-27 ENCOUNTER — Other Ambulatory Visit (HOSPITAL_COMMUNITY): Payer: BLUE CROSS/BLUE SHIELD | Attending: Psychiatry | Admitting: Psychiatry

## 2015-04-27 ENCOUNTER — Encounter (HOSPITAL_COMMUNITY): Payer: Self-pay | Admitting: Psychiatry

## 2015-04-27 DIAGNOSIS — F331 Major depressive disorder, recurrent, moderate: Secondary | ICD-10-CM | POA: Diagnosis not present

## 2015-04-27 MED ORDER — DIAZEPAM 10 MG PO TABS: 10.0000 mg | ORAL_TABLET | Freq: Two times a day (BID) | ORAL | Status: DC | PRN
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
## 2015-04-27 NOTE — Progress Notes (Signed)
    Daily Group Progress Note  Program: IOP  Group Time: 9:00-10:30  Participation Level: Active  Behavioral Response: Appropriate  Type of Therapy:  Group Therapy  Summary of Progress: Pt. Met with case manager and psychiatrist.      Group Time: 10:30-12:00  Participation Level:  Active  Behavioral Response: Appropriate  Type of Therapy: Psycho-education Group  Summary of Progress: Pt. Presented with flat affect, mildly tearful. Pt. Reported that she rested well over the weekend because she did not have stress of immediately returning to work. Pt. Shared that work and being single mother of four children are her most significant stressors. Pt. Discussed her history of depression since the births of her children.   Nancie Neas, LPC

## 2015-04-28 ENCOUNTER — Other Ambulatory Visit (HOSPITAL_COMMUNITY): Payer: BLUE CROSS/BLUE SHIELD

## 2015-04-28 NOTE — Progress Notes (Signed)
    Daily Group Progress Note  Program: IOP  Group Time: 9:00-10:30  Participation Level: Active  Behavioral Response: Appropriate  Type of Therapy:  Group Therapy  Summary of Progress: Pt. Presented with mildly brightened affect compared to yesterday's group. Pt. Shares appropriately in group, at times tearful and anxious. Pt. Participated in discussion bout self-regulating behaviors (i.e., breathwork, tapping, meditation). Pt. Discussed the stigma of depression in her workplace, feeling misunderstood and pressured by her manager.     Group Time: 10:30-12:00  Participation Level:  Active  Behavioral Response: Appropriate  Type of Therapy: Psycho-education Group  Summary of Progress: Pt. Participated in group co-facilitated by Doreene Adas about developing self-efficacy, internal versus external locus of control, and reframing beliefs about success and failure.  Nancie Neas, LPC

## 2015-04-29 ENCOUNTER — Other Ambulatory Visit (HOSPITAL_COMMUNITY): Payer: BLUE CROSS/BLUE SHIELD | Admitting: Psychiatry

## 2015-04-29 DIAGNOSIS — F331 Major depressive disorder, recurrent, moderate: Secondary | ICD-10-CM | POA: Diagnosis not present

## 2015-04-29 MED ORDER — CLONAZEPAM 1 MG PO TABS: 1.0000 mg | ORAL_TABLET | Freq: Three times a day (TID) | ORAL | Status: DC

## 2015-04-30 ENCOUNTER — Other Ambulatory Visit (HOSPITAL_COMMUNITY): Payer: BLUE CROSS/BLUE SHIELD | Admitting: Psychiatry

## 2015-04-30 DIAGNOSIS — F331 Major depressive disorder, recurrent, moderate: Secondary | ICD-10-CM

## 2015-04-30 NOTE — Progress Notes (Signed)
    Daily Group Progress Note  Program: IOP  Group Time: 9:00-10:30  Participation Level: Active  Behavioral Response: Appropriate  Type of Therapy:  Group Therapy  Summary of Progress: Pt. Reported that she was feeling "ok". Pt. Continues to be challenged by low-self esteem and feeling that she has failed her children because she is not able to be with them all of the time due to custody agreement. Pt. Was tearful about fears that she could not be with her 22 year old daughter during a challenging time.      Group Time: 10:30-12:00  Participation Level:  Active  Behavioral Response: Appropriate  Type of Therapy: Psycho-education Group  Summary of Progress: Pt. Did not attend this part of group due to receiving phone call that her son needed to be picked up from school.   Nancie Neas, LPC
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
## 2015-05-03 ENCOUNTER — Other Ambulatory Visit (HOSPITAL_COMMUNITY): Payer: BLUE CROSS/BLUE SHIELD

## 2015-05-04 ENCOUNTER — Other Ambulatory Visit (HOSPITAL_COMMUNITY): Payer: BLUE CROSS/BLUE SHIELD | Admitting: Psychiatry

## 2015-05-04 DIAGNOSIS — F331 Major depressive disorder, recurrent, moderate: Secondary | ICD-10-CM | POA: Diagnosis not present

## 2015-05-04 NOTE — Progress Notes (Signed)
05/04/15 Encounter: 9:30 AM - 12:00 PM  Purpose: Addressing coping skills when facing extreme emotions. Intervention: Open discussion on using de-escalation tools to remedy "black & white thinking". Effect: Courtney [name] stated that she finds swimming & exposure to sunlight to be very effective in de-escalation from her severe anxiety.  Doreene Adas CPSS
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
## 2015-05-05 ENCOUNTER — Other Ambulatory Visit (HOSPITAL_COMMUNITY): Payer: BLUE CROSS/BLUE SHIELD | Admitting: Psychiatry

## 2015-05-05 ENCOUNTER — Encounter (HOSPITAL_COMMUNITY): Payer: BLUE CROSS/BLUE SHIELD | Admitting: Psychiatry

## 2015-05-05 DIAGNOSIS — F331 Major depressive disorder, recurrent, moderate: Secondary | ICD-10-CM

## 2015-05-05 MED ORDER — DULOXETINE HCL 30 MG PO CPEP: 30.0000 mg | ORAL_CAPSULE | Freq: Every day | ORAL | Status: DC

## 2015-05-05 NOTE — Telephone Encounter (Signed)
D:  Placed second phone call to Lindajo Royal (HR at [facility] [phone]).  Left vm inquiring about how to extend patient's short term disability and FMLA.  A:  Await phone call.

## 2015-05-05 NOTE — Addendum Note (Signed)
Addended by: Donnelly Angelica D on: 05/05/2015 10:36 AM   Modules accepted: Orders, Medications

## 2015-05-06 ENCOUNTER — Other Ambulatory Visit (HOSPITAL_COMMUNITY): Payer: BLUE CROSS/BLUE SHIELD | Admitting: Psychiatry

## 2015-05-06 DIAGNOSIS — F331 Major depressive disorder, recurrent, moderate: Secondary | ICD-10-CM

## 2015-05-06 NOTE — Progress Notes (Signed)
05/06/15 Encounter: 9 AM - 12 PM  Purpose: Facing extreme emotion through practicing assertiveness. Intervention: Open guided discussion on healthy self-expression techniques. Effect: Courtney [name]) recognized the "newness" of the emotion anger as needing healthy outlets.  Doreene Adas CPSS
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
## 2015-05-07 ENCOUNTER — Other Ambulatory Visit (HOSPITAL_COMMUNITY): Payer: BLUE CROSS/BLUE SHIELD
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
## 2015-05-10 ENCOUNTER — Other Ambulatory Visit (HOSPITAL_COMMUNITY): Payer: BLUE CROSS/BLUE SHIELD | Admitting: Psychiatry

## 2015-05-10 DIAGNOSIS — F331 Major depressive disorder, recurrent, moderate: Secondary | ICD-10-CM | POA: Diagnosis not present

## 2015-05-10 NOTE — Progress Notes (Signed)
This encounter was created in error - please disregard.

## 2015-05-10 NOTE — Progress Notes (Signed)
    Daily Group Progress Note  Program: IOP  Group Time: 9:00-10:30  Participation Level: Active  Behavioral Response: Appropriate  Type of Therapy:  Psycho-education Group  Summary of Progress: Pt. Participated in medication education group with [place].      Group Time: 10:30-12:00  Participation Level:  Active  Behavioral Response: Appropriate  Type of Therapy: Group Therapy  Summary of Progress: Pt. Presented with brightened affect, talkative, engaged in group process. Pt. Reported that she was very happy on Saturday with her children. Pt. Reported that she watched a few minutes of a sad movie on Sunday that made her feel sad and cry. Pt.'s feelings were normalized and practiced learning how to accept and be present with a full range of emotions. Pt. Continues to discuss challenge of "being happy". Discussed books, movies, humor, cooking and entertaining family and friends and engagement in writing as happy experiences.  Nancie Neas, LPC

## 2015-05-11 ENCOUNTER — Other Ambulatory Visit (HOSPITAL_COMMUNITY): Payer: BLUE CROSS/BLUE SHIELD | Admitting: Psychiatry

## 2015-05-11 DIAGNOSIS — F331 Major depressive disorder, recurrent, moderate: Secondary | ICD-10-CM

## 2015-05-11 MED ORDER — DIAZEPAM 10 MG PO TABS: 10.0000 mg | ORAL_TABLET | Freq: Three times a day (TID) | ORAL | Status: DC | PRN
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
## 2015-05-11 NOTE — Progress Notes (Signed)
Patient ID: Courtney [name], female   DOB: 09-Jan-1971, 44 y.o.   MRN: [id number] Discharge Note  Patient:  Courtney [name] is an 44 y.o., female DOB:  12/27/1970  Date of Admission:  (Not on file)  Date of Discharge:  05/11/2015  Reason for Admission:depression and anxiety  IOP Course:  [name] attended and participated.  She remains depressed but not suicidal and less so than on admission,  She is very grateful for the help and was able to make friends who understand what she is going through, she says.  She tried clonazepam because the diazepam was not lasting long enough.  The clonazepam however made her drowsy and feel loopy.  The valium looking back on it was more helpful than she realized.  Lexapro was tapered over 2 weeks and Cymbalta begun at 30 mg for a week and then was to be increased to 60 mg daily as it had helped the most of all the antidepressants she had tried she said.  She was not ready to return to work at the time of discharge but was steadily improving.  Mental Status at Discharge:depressed but not suicidal, anxious but responding to medication  Lab Results: No results found for this or any previous visit (from the past 58 hour(s)).   Current outpatient prescriptions:  .  albuterol (PROVENTIL HFA;VENTOLIN HFA) 108 (90 BASE) MCG/ACT inhaler, Inhale 2 puffs into the lungs every 6 (six) hours as needed for wheezing or shortness of breath., Disp: 1 Inhaler, Rfl: 0 .  diazepam (VALIUM) 10 MG tablet, Take 1 tablet (10 mg total) by mouth every 8 (eight) hours as needed for anxiety., Disp: 90 tablet, Rfl: 0 .  DULoxetine (CYMBALTA) 30 MG capsule, Take 1 capsule (30 mg total) by mouth daily., Disp: 30 capsule, Rfl: 0 .  ibuprofen (ADVIL,MOTRIN) 200 MG tablet, Take 400 mg by mouth every 6 (six) hours as needed for fever, headache, mild pain, moderate pain or cramping., Disp: , Rfl:  .  lisinopril-hydrochlorothiazide (PRINZIDE,ZESTORETIC) 20-12.5 MG per tablet, Take 1 tablet by mouth  2 (two) times daily., Disp: , Rfl:  .  zolpidem (AMBIEN) 10 MG tablet, Take 1 tablet (10 mg total) by mouth at bedtime as needed for sleep., Disp: 30 tablet, Rfl: 0  Axis Diagnosis:  Major depression, recurrent moderate.  Generalized anxiety disorder   Level of Care:  IOP  Discharge destination:  Other:  follow up with her psychiatrist and a referral to a new therapist as her old one is no longer available  Is patient on multiple antipsychotic therapies at discharge:  No    Has Patient had three or more failed trials of antipsychotic monotherapy by history:  Negative  Patient phone:  [phone] (home)  Patient address:   [place] [address],   Follow-up recommendations:  Activity:  continue current activity Diet:  continue current diet  Comments:  none  The patient received suicide prevention pamphlet:  Yes Belongings returned:  na  Donnelly Angelica 05/11/2015, 11:49 AM

## 2015-05-11 NOTE — Patient Instructions (Signed)
Patient completed MH-IOP today.  Will follow up with Dr. Adele Schilder on 05-18-15 @ 1:30 pm and a therapist at [facility] [place] on 05-19-15.  Encouraged support groups.  Return to work on 05-24-15.

## 2015-05-11 NOTE — Progress Notes (Signed)
05/10/15 Encounter: 9 - 10 AM, 11 - 12 PM  Purpose: To address methods of catharsis in recovery. Intervention: Open guided discussion on "normal" as a healthy range of emotions. Effect: Kyra Manges) conveyed that true belief in self is to "not limit what you could be".  Doreene Adas CPSS

## 2015-05-11 NOTE — Progress Notes (Signed)
05/11/15 Encounter: 9 AM - 12 PM  Purpose: To address methods of anger management in recovery. Intervention: Open guided discussion on per-based anger management strategies. Effect: Courtney [name] recognized the important difference between being apologetic (passivity) & apologizing for anger-based event.  Doreene Adas CPSS

## 2015-05-12 ENCOUNTER — Other Ambulatory Visit (HOSPITAL_COMMUNITY): Payer: BLUE CROSS/BLUE SHIELD

## 2015-05-12 NOTE — Progress Notes (Signed)
    Daily Group Progress Note  Program: IOP  Group Time: 9:00-10:30  Participation Level: Active  Behavioral Response: Appropriate  Type of Therapy:  Group Therapy  Summary of Progress: Pt. Prepared for discharge today. Pt. Appeared with some anxiety. Pt. Shared that she had been discouraged by inconvenience and unplanned expense of locking her keys in her car. Pt. Discussed ongoing financial stress that has been compounded by her depression and her fears about creating a support system. Pt. Expressed hopefulness about connecting with a new therapist and working on issues related to self-esteem and self-acceptance.      Group Time: 10:30-12:00  Participation Level:  Active  Behavioral Response: Appropriate  Type of Therapy: Psycho-education Group  Summary of Progress: Pt. Participated in session on anger management co-facilitated by Doreene Adas. Pt. Discussed her experience with anger and the challenge of being a primarily passive personality and then becoming very aggressive or tearful.  Nancie Neas, LPC

## 2015-05-13 ENCOUNTER — Other Ambulatory Visit (HOSPITAL_COMMUNITY): Payer: BLUE CROSS/BLUE SHIELD

## 2015-05-14 ENCOUNTER — Other Ambulatory Visit (HOSPITAL_COMMUNITY): Payer: BLUE CROSS/BLUE SHIELD

## 2015-05-17 ENCOUNTER — Other Ambulatory Visit (HOSPITAL_COMMUNITY): Payer: BLUE CROSS/BLUE SHIELD

## 2015-05-18 ENCOUNTER — Ambulatory Visit (HOSPITAL_COMMUNITY): Payer: Self-pay | Admitting: Psychiatry

## 2015-05-18 ENCOUNTER — Other Ambulatory Visit (HOSPITAL_COMMUNITY): Payer: BLUE CROSS/BLUE SHIELD

## 2015-05-19 ENCOUNTER — Other Ambulatory Visit (HOSPITAL_COMMUNITY): Payer: BLUE CROSS/BLUE SHIELD

## 2015-05-20 ENCOUNTER — Emergency Department (HOSPITAL_COMMUNITY): Payer: BLUE CROSS/BLUE SHIELD

## 2015-05-20 ENCOUNTER — Other Ambulatory Visit (HOSPITAL_COMMUNITY): Payer: BLUE CROSS/BLUE SHIELD

## 2015-05-20 ENCOUNTER — Encounter (HOSPITAL_COMMUNITY): Payer: Self-pay | Admitting: Emergency Medicine

## 2015-05-20 ENCOUNTER — Ambulatory Visit (HOSPITAL_COMMUNITY): Payer: Self-pay | Admitting: Psychiatry

## 2015-05-20 ENCOUNTER — Emergency Department (HOSPITAL_COMMUNITY)
Admission: EM | Admit: 2015-05-20 | Discharge: 2015-05-20 | Disposition: A | Discharge status: Home / Self Care | Payer: BLUE CROSS/BLUE SHIELD | Attending: Emergency Medicine | Admitting: Emergency Medicine

## 2015-05-20 DIAGNOSIS — Z862 Personal history of diseases of the blood and blood-forming organs and certain disorders involving the immune mechanism: Secondary | ICD-10-CM | POA: Insufficient documentation

## 2015-05-20 DIAGNOSIS — R079 Chest pain, unspecified: Secondary | ICD-10-CM | POA: Insufficient documentation

## 2015-05-20 DIAGNOSIS — Z8701 Personal history of pneumonia (recurrent): Secondary | ICD-10-CM | POA: Insufficient documentation

## 2015-05-20 DIAGNOSIS — Z86010 Personal history of colonic polyps: Secondary | ICD-10-CM | POA: Diagnosis not present

## 2015-05-20 DIAGNOSIS — R109 Unspecified abdominal pain: Secondary | ICD-10-CM | POA: Diagnosis present

## 2015-05-20 DIAGNOSIS — Z8719 Personal history of other diseases of the digestive system: Secondary | ICD-10-CM | POA: Insufficient documentation

## 2015-05-20 DIAGNOSIS — E669 Obesity, unspecified: Secondary | ICD-10-CM | POA: Diagnosis not present

## 2015-05-20 DIAGNOSIS — R112 Nausea with vomiting, unspecified: Secondary | ICD-10-CM | POA: Insufficient documentation

## 2015-05-20 DIAGNOSIS — R1013 Epigastric pain: Secondary | ICD-10-CM | POA: Diagnosis not present

## 2015-05-20 DIAGNOSIS — Z9049 Acquired absence of other specified parts of digestive tract: Secondary | ICD-10-CM | POA: Diagnosis not present

## 2015-05-20 DIAGNOSIS — Z86711 Personal history of pulmonary embolism: Secondary | ICD-10-CM | POA: Diagnosis not present

## 2015-05-20 DIAGNOSIS — J45909 Unspecified asthma, uncomplicated: Secondary | ICD-10-CM | POA: Insufficient documentation

## 2015-05-20 DIAGNOSIS — Z3202 Encounter for pregnancy test, result negative: Secondary | ICD-10-CM | POA: Diagnosis not present

## 2015-05-20 DIAGNOSIS — I10 Essential (primary) hypertension: Secondary | ICD-10-CM | POA: Insufficient documentation

## 2015-05-20 DIAGNOSIS — Z9851 Tubal ligation status: Secondary | ICD-10-CM | POA: Diagnosis not present

## 2015-05-20 DIAGNOSIS — Z86718 Personal history of other venous thrombosis and embolism: Secondary | ICD-10-CM | POA: Insufficient documentation

## 2015-05-20 DIAGNOSIS — Z720 Tobacco use: Secondary | ICD-10-CM | POA: Insufficient documentation

## 2015-05-20 DIAGNOSIS — R1012 Left upper quadrant pain: Secondary | ICD-10-CM | POA: Diagnosis not present

## 2015-05-20 DIAGNOSIS — R1010 Upper abdominal pain, unspecified: Secondary | ICD-10-CM

## 2015-05-20 LAB — CBC
HCT: 44 % (ref 36.0–46.0)
HEMOGLOBIN: 14.6 g/dL (ref 12.0–15.0)
MCH: 31.7 pg (ref 26.0–34.0)
MCHC: 33.2 g/dL (ref 30.0–36.0)
MCV: 95.7 fL (ref 78.0–100.0)
PLATELETS: 322 10*3/uL (ref 150–400)
RBC: 4.6 MIL/uL (ref 3.87–5.11)
RDW: 13.4 % (ref 11.5–15.5)
WBC: 11.4 10*3/uL — ABNORMAL HIGH (ref 4.0–10.5)

## 2015-05-20 LAB — HEPATIC FUNCTION PANEL
ALT: 51 U/L (ref 14–54)
AST: 46 U/L — ABNORMAL HIGH (ref 15–41)
Albumin: 3.7 g/dL (ref 3.5–5.0)
Alkaline Phosphatase: 50 U/L (ref 38–126)
Bilirubin, Direct: <0.1 mg/dL — ABNORMAL LOW (ref 0.1–0.5)
TOTAL PROTEIN: 7.4 g/dL (ref 6.5–8.1)
Total Bilirubin: 0.4 mg/dL (ref 0.3–1.2)

## 2015-05-20 LAB — BASIC METABOLIC PANEL
ANION GAP: 9 (ref 5–15)
BUN: 12 mg/dL (ref 6–20)
CHLORIDE: 100 mmol/L — AB (ref 101–111)
CO2: 25 mmol/L (ref 22–32)
CREATININE: 0.68 mg/dL (ref 0.44–1.00)
Calcium: 8.8 mg/dL — ABNORMAL LOW (ref 8.9–10.3)
GFR calc Af Amer: >60 mL/min (ref >60)
GFR calc non Af Amer: >60 mL/min (ref >60)
Glucose, Bld: 99 mg/dL (ref 65–99)
Potassium: 4.4 mmol/L (ref 3.5–5.1)
SODIUM: 134 mmol/L — AB (ref 135–145)

## 2015-05-20 LAB — POC OCCULT BLOOD, ED: Fecal Occult Bld: NEGATIVE

## 2015-05-20 LAB — I-STAT TROPONIN, ED: TROPONIN I, POC: 0 ng/mL (ref 0.00–0.08)

## 2015-05-20 LAB — I-STAT BETA HCG BLOOD, ED (MC, WL, AP ONLY)

## 2015-05-20 LAB — LIPASE, BLOOD: Lipase: 37 U/L (ref 22–51)

## 2015-05-20 MED ORDER — SODIUM CHLORIDE 0.9 % IV BOLUS (SEPSIS)
1000.0000 mL | Freq: Once | INTRAVENOUS | Status: AC
  Administered 2015-05-20: 1000 mL via INTRAVENOUS

## 2015-05-20 MED ORDER — ONDANSETRON HCL 4 MG/2ML IJ SOLN
4.0000 mg | Freq: Once | INTRAMUSCULAR | Status: AC
  Administered 2015-05-20: 4 mg via INTRAVENOUS
  Filled 2015-05-20: qty 2

## 2015-05-20 MED ORDER — HYDROMORPHONE HCL 1 MG/ML IJ SOLN
1.0000 mg | Freq: Once | INTRAMUSCULAR | Status: AC
  Administered 2015-05-20: 1 mg via INTRAVENOUS
  Filled 2015-05-20: qty 1

## 2015-05-20 MED ORDER — OMEPRAZOLE 20 MG PO CPDR: 20.0000 mg | DELAYED_RELEASE_CAPSULE | Freq: Every day | ORAL | Status: DC

## 2015-05-20 MED ORDER — IOHEXOL 300 MG/ML  SOLN
50.0000 mL | Freq: Once | INTRAMUSCULAR | Status: DC | PRN
  Administered 2015-05-20: 50 mL via ORAL
  Filled 2015-05-20: qty 50

## 2015-05-20 MED ORDER — METOCLOPRAMIDE HCL 5 MG/ML IJ SOLN
10.0000 mg | Freq: Once | INTRAMUSCULAR | Status: AC
  Administered 2015-05-20: 10 mg via INTRAVENOUS
  Filled 2015-05-20: qty 2

## 2015-05-20 MED ORDER — FENTANYL CITRATE (PF) 100 MCG/2ML IJ SOLN
100.0000 ug | Freq: Once | INTRAMUSCULAR | Status: DC
  Filled 2015-05-20: qty 2

## 2015-05-20 MED ORDER — ONDANSETRON 8 MG PO TBDP: 8.0000 mg | ORAL_TABLET | Freq: Three times a day (TID) | ORAL | Status: AC | PRN

## 2015-05-20 MED ORDER — ALBUTEROL SULFATE HFA 108 (90 BASE) MCG/ACT IN AERS
2.0000 | INHALATION_SPRAY | RESPIRATORY_TRACT | Status: DC
  Administered 2015-05-20: 2 via RESPIRATORY_TRACT
  Filled 2015-05-20: qty 6.7

## 2015-05-20 MED ORDER — HYDROCODONE-ACETAMINOPHEN 5-325 MG PO TABS: 1.0000 | ORAL_TABLET | ORAL | Status: DC | PRN

## 2015-05-20 MED ORDER — LORAZEPAM 2 MG/ML IJ SOLN
1.0000 mg | Freq: Once | INTRAMUSCULAR | Status: AC
  Administered 2015-05-20: 1 mg via INTRAVENOUS
  Filled 2015-05-20: qty 1

## 2015-05-20 MED ORDER — PROMETHAZINE HCL 25 MG PO TABS: 25.0000 mg | ORAL_TABLET | Freq: Four times a day (QID) | ORAL | Status: AC | PRN

## 2015-05-20 MED ORDER — IOHEXOL 300 MG/ML  SOLN
100.0000 mL | Freq: Once | INTRAMUSCULAR | Status: AC | PRN
  Administered 2015-05-20: 100 mL via INTRAVENOUS

## 2015-05-20 NOTE — ED Notes (Signed)
MD at bedside. 

## 2015-05-20 NOTE — ED Notes (Signed)
Elmo Putt NT chaperoned stool sample retrieval
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
## 2015-05-20 NOTE — Discharge Instructions (Signed)

## 2015-05-20 NOTE — ED Notes (Signed)
6 yof presents to ED with c/o chest pain, epigastric pain, and SOB. Patient states this started yesterday, she tried to sleep it off. This morning she woke up with continued pain and vomited blood. Patient states she has a hx of PE and hiatal hernia that she never got fixed. States she feels similar to when she did have a PE, "My stomach feels like its twisted and out of place". Patient is dry heaving in triage. Abd soft and tender upon palpation. Skin is cool and clammy. Strong palpable peripheral pulses, clear lung and heart sounds in triage.

## 2015-05-20 NOTE — ED Notes (Signed)
Pt complaining of a burning pain in her stomach starting a week ago when she started taking Cymbalta. Patient states that she vomited blood this morning. She states she has a history of pulmonary emboli, ulcers and a hiatal hernia.

## 2015-05-20 NOTE — ED Notes (Signed)
Bed: EH20 Expected date:  Expected time:  Means of arrival:  Comments: TR 2

## 2015-05-20 NOTE — ED Notes (Signed)
Pt with no c/o SOB or dyspnea while ambulating. SP02 @ 81/82%. [place] notified and at bedside. Pt placed on 2L [place].

## 2015-05-20 NOTE — ED Notes (Signed)
Patient transported to CT 

## 2015-05-20 NOTE — ED Provider Notes (Signed)
ECG interpretation #2  Date: 05/20/2015  Rate: 84  Rhythm: normal sinus rhythm  QRS Axis: normal  Intervals: normal  ST/T Wave abnormalities: normal  Conduction Disutrbances: none  Narrative Interpretation:   Old EKG Reviewed: No significant changes noted  6:37 PM Patient is feeling better at this time.  CT scan without acute pathology.  No vomiting in the emergency department.  No hematemesis.  Vital signs remained good.  Patient does have a history of hiatal hernia.  Patient be placed on Prilosec.  Outpatient GI follow-up.  Her vomiting of blood sounds more consistent with possible Mallory-Weiss tear.  Discharge home in good condition.  Dg Chest 2 View  05/20/2015   CLINICAL DATA:  Shortness of breath, chest pain  EXAM: CHEST  2 VIEW  COMPARISON:  06/29/2014  FINDINGS: There is chronic elevation of the left diaphragm. There is no focal parenchymal opacity. There is no pleural effusion or pneumothorax. The heart and mediastinal contours are unremarkable.  The osseous structures are unremarkable.  IMPRESSION: No active cardiopulmonary disease.   Electronically Signed   By: Kathreen Devoid   On: 05/20/2015 14:14   Ct Abdomen Pelvis W Contrast  05/20/2015   CLINICAL DATA:  Upper abdominal pain. Lower chest pain. Hematemesis.  EXAM: CT ABDOMEN AND PELVIS WITH CONTRAST  TECHNIQUE: Multidetector CT imaging of the abdomen and pelvis was performed using the standard protocol following bolus administration of intravenous contrast.  CONTRAST:  152mL OMNIPAQUE IOHEXOL 300 MG/ML  SOLN  COMPARISON:  CT of the abdomen and pelvis 06/29/2014.  FINDINGS: Chronic scarring and bronchiectasis is again seen at the left lung base. The right lung base is clear. There is chronic elevation of the left hemidiaphragm with mild gastric distention. The heart size is normal. No significant pleural or pericardial effusion is present.  Diffuse fatty infiltration of the liver is again noted. Spleen is within normal limits. The  duodenum and pancreas are unremarkable. The common bile duct is within normal limits following cholecystectomy. The adrenal glands are normal bilaterally. Kidneys and ureters are within normal limits.  The rectosigmoid colon is within normal limits. The more proximal colon is unremarkable. The appendix is visualized and normal. Uterus and adnexa are within normal limits for age.  There is some residual subcutaneous stranding and scar tissue in the region of the previously seen subcutaneous abscess. No discrete fluid collection is present.  The bone windows are unremarkable.  IMPRESSION: 1. Mild stranding in scar tissue subjacent to the umbilicus with resolution of the previously seen subcutaneous abscess. 2. Stable chronic elevation of left hemidiaphragm with scarring at left lung base. 3. Moderate gastric distention as previously seen. 4. Stable diffuse fatty infiltration of the liver.   Electronically Signed   By: San Morelle M.D.   On: 05/20/2015 16:19        Jola Schmidt, MD 05/20/15 Bosie Helper

## 2015-05-21 ENCOUNTER — Telehealth (HOSPITAL_COMMUNITY): Payer: Self-pay

## 2015-05-21 ENCOUNTER — Other Ambulatory Visit (HOSPITAL_COMMUNITY): Payer: BLUE CROSS/BLUE SHIELD

## 2015-05-21 NOTE — Telephone Encounter (Signed)
Medication refill. - Telephone call with patient stating she had to miss her appt. from 05/20/15 due to in the ED with diagnosed gastritis.  States not related to medication and needs a refill of Cymbalta as does not have enough through the weekend.   Informed Dr. Lovena Le called in a new order for patient's Cymbalta on 05/05/15 to the [facility] [facility] in [facility] on [address].  Agreed to call the [place] [facility] to verify they had the order and to call Courtney [name] back and to also assist patient with scheduling a new morning appointment.  Courtney [name], pharmacist at [facility] in [place] to verify order from Dr. Lovena Le 05/05/15 for Cymbalt and to make sure this is being filled for patient.  Called patient back to inform the order was there as Dr. Lovena Le had completed 05/05/15 and arranged for patient to return to see Dr. Adele Schilder on 05/25/15 at 11:00am.  Patient agreed with new appointment time and will call back if any problems prior to then.

## 2015-05-24 ENCOUNTER — Other Ambulatory Visit (HOSPITAL_COMMUNITY): Payer: BLUE CROSS/BLUE SHIELD

## 2015-05-25 ENCOUNTER — Telehealth (HOSPITAL_COMMUNITY): Payer: Self-pay

## 2015-05-25 ENCOUNTER — Encounter (HOSPITAL_COMMUNITY): Payer: Self-pay

## 2015-05-25 ENCOUNTER — Ambulatory Visit (INDEPENDENT_AMBULATORY_CARE_PROVIDER_SITE_OTHER): Payer: BLUE CROSS/BLUE SHIELD | Admitting: Psychiatry

## 2015-05-25 ENCOUNTER — Encounter (HOSPITAL_COMMUNITY): Payer: Self-pay | Admitting: Psychiatry

## 2015-05-25 ENCOUNTER — Other Ambulatory Visit (HOSPITAL_COMMUNITY): Payer: BLUE CROSS/BLUE SHIELD

## 2015-05-25 VITALS — BP 138/78 | HR 98 | Ht 61.0 in | Wt 238.0 lb

## 2015-05-25 DIAGNOSIS — F410 Panic disorder [episodic paroxysmal anxiety] without agoraphobia: Secondary | ICD-10-CM | POA: Diagnosis not present

## 2015-05-25 DIAGNOSIS — F4310 Post-traumatic stress disorder, unspecified: Secondary | ICD-10-CM

## 2015-05-25 DIAGNOSIS — F331 Major depressive disorder, recurrent, moderate: Secondary | ICD-10-CM | POA: Diagnosis not present

## 2015-05-25 MED ORDER — LAMOTRIGINE 25 MG PO TABS: ORAL_TABLET | ORAL | Status: DC

## 2015-05-25 MED ORDER — DULOXETINE HCL 60 MG PO CPEP: 60.0000 mg | ORAL_CAPSULE | Freq: Every day | ORAL | Status: DC

## 2015-05-25 NOTE — Telephone Encounter (Signed)
Opened in error

## 2015-05-25 NOTE — Telephone Encounter (Signed)
Medication management - Second letter faxed to Ms. Courtney [name] today at patient's HR department for [facility] [employer] and [employer] per review and authorization by Dr. Adele Schilder.   Patient covered to be out of work until 05/27/15 and then 5 hours a day per Dr. Adele Schilder.
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
## 2015-05-26 ENCOUNTER — Other Ambulatory Visit (HOSPITAL_COMMUNITY): Payer: BLUE CROSS/BLUE SHIELD

## 2015-05-27 ENCOUNTER — Other Ambulatory Visit (HOSPITAL_COMMUNITY): Payer: BLUE CROSS/BLUE SHIELD

## 2015-05-28 ENCOUNTER — Other Ambulatory Visit (HOSPITAL_COMMUNITY): Payer: BLUE CROSS/BLUE SHIELD

## 2015-05-31 ENCOUNTER — Other Ambulatory Visit (HOSPITAL_COMMUNITY): Payer: BLUE CROSS/BLUE SHIELD

## 2015-06-01 ENCOUNTER — Other Ambulatory Visit (HOSPITAL_COMMUNITY): Payer: BLUE CROSS/BLUE SHIELD

## 2015-06-02 ENCOUNTER — Other Ambulatory Visit (HOSPITAL_COMMUNITY): Payer: BLUE CROSS/BLUE SHIELD

## 2015-06-03 ENCOUNTER — Other Ambulatory Visit (HOSPITAL_COMMUNITY): Payer: BLUE CROSS/BLUE SHIELD

## 2015-06-04 ENCOUNTER — Other Ambulatory Visit (HOSPITAL_COMMUNITY): Payer: BLUE CROSS/BLUE SHIELD

## 2015-06-07 ENCOUNTER — Other Ambulatory Visit (HOSPITAL_COMMUNITY): Payer: BLUE CROSS/BLUE SHIELD

## 2015-06-08 ENCOUNTER — Other Ambulatory Visit (HOSPITAL_COMMUNITY): Payer: BLUE CROSS/BLUE SHIELD

## 2015-06-09 ENCOUNTER — Other Ambulatory Visit (HOSPITAL_COMMUNITY): Payer: BLUE CROSS/BLUE SHIELD

## 2015-06-10 ENCOUNTER — Other Ambulatory Visit (HOSPITAL_COMMUNITY): Payer: BLUE CROSS/BLUE SHIELD

## 2015-06-11 ENCOUNTER — Other Ambulatory Visit (HOSPITAL_COMMUNITY): Payer: BLUE CROSS/BLUE SHIELD

## 2015-06-14 ENCOUNTER — Other Ambulatory Visit (HOSPITAL_COMMUNITY): Payer: BLUE CROSS/BLUE SHIELD

## 2015-06-16 ENCOUNTER — Ambulatory Visit (INDEPENDENT_AMBULATORY_CARE_PROVIDER_SITE_OTHER): Payer: BLUE CROSS/BLUE SHIELD | Admitting: Psychiatry

## 2015-06-16 ENCOUNTER — Encounter (HOSPITAL_COMMUNITY): Payer: Self-pay | Admitting: Psychiatry

## 2015-06-16 VITALS — BP 122/80 | HR 64 | Resp 12 | Wt 234.6 lb

## 2015-06-16 DIAGNOSIS — F4310 Post-traumatic stress disorder, unspecified: Secondary | ICD-10-CM

## 2015-06-16 DIAGNOSIS — F410 Panic disorder [episodic paroxysmal anxiety] without agoraphobia: Secondary | ICD-10-CM

## 2015-06-16 DIAGNOSIS — F331 Major depressive disorder, recurrent, moderate: Secondary | ICD-10-CM

## 2015-06-16 MED ORDER — DULOXETINE HCL 60 MG PO CPEP: 60.0000 mg | ORAL_CAPSULE | Freq: Every day | ORAL | Status: DC

## 2015-06-16 MED ORDER — LAMOTRIGINE 25 MG PO TABS: ORAL_TABLET | ORAL | Status: DC
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
## 2015-07-26 ENCOUNTER — Telehealth (HOSPITAL_COMMUNITY): Payer: Self-pay

## 2015-07-26 DIAGNOSIS — F331 Major depressive disorder, recurrent, moderate: Secondary | ICD-10-CM

## 2015-07-26 MED ORDER — DULOXETINE HCL 60 MG PO CPEP: 60.0000 mg | ORAL_CAPSULE | Freq: Every day | ORAL | Status: DC

## 2015-07-26 NOTE — Telephone Encounter (Signed)
Telephone call with patient to follow up on message she left she was in need of a refill of her Cymbalta.  Patient reports she is doing well but is out of Cybalta as of this date and does not return to see Dr. Adele Schilder until 08/17/14.  Patient reports not missing any medication but has stopped taking Lamictal as states she does not feel she needs this.  Patient requested a new Cymbalta order be sent to her new [facility] in [place], [place].  Met with Dr. Salem Senate who approved a one time refill as patient returns to see Dr Adele Schilder on 08/17/14. New order approved and e-scribed to patient's new [facility] in [place], [place].

## 2015-08-18 ENCOUNTER — Encounter (HOSPITAL_COMMUNITY): Payer: Self-pay | Admitting: Psychiatry

## 2015-08-18 ENCOUNTER — Ambulatory Visit (INDEPENDENT_AMBULATORY_CARE_PROVIDER_SITE_OTHER): Payer: BLUE CROSS/BLUE SHIELD | Admitting: Psychiatry

## 2015-08-18 VITALS — BP 130/92 | HR 80 | Resp 12 | Ht 61.0 in | Wt 228.2 lb

## 2015-08-18 DIAGNOSIS — F331 Major depressive disorder, recurrent, moderate: Secondary | ICD-10-CM | POA: Diagnosis not present

## 2015-08-18 DIAGNOSIS — F4310 Post-traumatic stress disorder, unspecified: Secondary | ICD-10-CM | POA: Diagnosis not present

## 2015-08-18 MED ORDER — DULOXETINE HCL 30 MG PO CPEP: 30.0000 mg | ORAL_CAPSULE | Freq: Every day | ORAL | Status: DC

## 2015-08-18 MED ORDER — VORTIOXETINE HBR 10 MG PO TABS: 10.0000 mg | ORAL_TABLET | Freq: Every day | ORAL | Status: DC

## 2015-08-18 MED ORDER — DIAZEPAM 5 MG PO TABS: 5.0000 mg | ORAL_TABLET | Freq: Every day | ORAL | Status: DC | PRN
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
## 2015-08-23 ENCOUNTER — Other Ambulatory Visit (HOSPITAL_COMMUNITY): Payer: Self-pay | Admitting: Psychiatry

## 2015-08-24 ENCOUNTER — Telehealth (HOSPITAL_COMMUNITY): Payer: Self-pay

## 2015-08-24 NOTE — Telephone Encounter (Signed)
Medication management - Telephone call with patient who reported Dr. Adele Schilder was starting her on a taper off of Cymbalta.  Reports she went to pick up her new refill to begin taper today but learned with her new insurance Cymbalta had gone from $6 a month to $168 and she cannot afford.  Patient reports she was given samples of Trintellex to begin and has started with this but is worried what she should do about Cymbalta if just completely stops as of this date?  Patient questions if she should go on some other SSRI or medication temporarily to help with taper and request information from Dr. Adele Schilder as soon as possible to manage transition.   Called patient back to question if she had looked on goodrx.com as they have coupons patient can use for medications.  Patient looked up medication coupon and printed off one she could use at [facility] for around $25.  States she will print out the coupon, take it to [facility] and have recent Cymbalta order transferred there to attempt to fill at a lower cost.  Agreed to inform provider of what patient's plan is to obtain one month supply to begin transition off of Cymbalta.

## 2015-09-15 ENCOUNTER — Telehealth (HOSPITAL_COMMUNITY): Payer: Self-pay

## 2015-09-15 DIAGNOSIS — F331 Major depressive disorder, recurrent, moderate: Secondary | ICD-10-CM

## 2015-09-15 NOTE — Telephone Encounter (Signed)
Patient calling for refill on Trintellix, her last visit was 1/4 and she got an rx for 28 pills, okay to refill? Patient does not currently have a f/u appointment I will let her know that this needs to be scheduled

## 2015-09-17 MED ORDER — VORTIOXETINE HBR 10 MG PO TABS: 10.0000 mg | ORAL_TABLET | Freq: Every day | ORAL | Status: DC

## 2015-09-17 NOTE — Telephone Encounter (Signed)
Okay sent 30 day supply to the pharmacy. I called patient and let her know, she stated that she is going to call Monday and make a f/u appointment

## 2015-09-17 NOTE — Telephone Encounter (Signed)
We can authorize 30 day Trintillex but she should see Dr.Agarwal for [place] evaluation.  She need appointment ASAP.

## 2015-09-21 ENCOUNTER — Telehealth (HOSPITAL_COMMUNITY): Payer: Self-pay

## 2015-09-21 DIAGNOSIS — F331 Major depressive disorder, recurrent, moderate: Secondary | ICD-10-CM

## 2015-09-21 NOTE — Telephone Encounter (Signed)
Telephone call with patient to follow up on message she left she could not afford Trintellex medication as states she ran out of samples Dr. Adele Schilder provided approximately 4 days ago and could not afford the $300 cost of new medication.  States the cost is $400 and her insurance only covers a $100 of that cost.  Reports she is still taking Cymbalta 30 mg a day but only has approximately 4 days remaining as was tapering off of it and onto Trintellex.  Patient requests call back from Dr.Arfeem to discuss if changes are needed or if he can continue to assist with samples. Reminded patient of need to schedule a follow up appointment as not appointment set currently and patient agreed with plan. Informed. Dr. Adele Schilder was off today but would send message to him to follow up upon his return expected 09/22/15.

## 2015-09-22 MED ORDER — VORTIOXETINE HBR 10 MG PO TABS: 10.0000 mg | ORAL_TABLET | Freq: Every day | ORAL | Status: DC

## 2015-09-22 NOTE — Telephone Encounter (Signed)
I spoke to patient.  She likes Triltellix 10 mg and she is feeling much better.  However she cannot afford as her insurance does not current.  We will provide samples for 6 weeks and in the meantime patient will find other ways so she can continue Trintelix 10 m  She will also schedule appointment when she comes to pick up the samples.  If she could not find other resources then we will discuss other options.

## 2015-09-28 ENCOUNTER — Ambulatory Visit (HOSPITAL_COMMUNITY): Payer: Self-pay | Admitting: Psychiatry

## 2015-10-28 ENCOUNTER — Telehealth (HOSPITAL_COMMUNITY): Payer: Self-pay

## 2015-10-28 NOTE — Telephone Encounter (Signed)
Patient is calling and would like to know if she can get some more samples of the Trintellix. Please advise thank you

## 2015-10-29 NOTE — Telephone Encounter (Signed)
I spoke to Dr. Adele Schilder, he no longer has samples, I called the patient and left a voicemail letting her know this and asked her to give me a call back

## 2015-11-10 ENCOUNTER — Encounter (HOSPITAL_COMMUNITY): Payer: Self-pay | Admitting: Psychiatry

## 2015-11-10 ENCOUNTER — Ambulatory Visit (INDEPENDENT_AMBULATORY_CARE_PROVIDER_SITE_OTHER): Payer: BLUE CROSS/BLUE SHIELD | Admitting: Psychiatry

## 2015-11-10 VITALS — BP 137/95 | HR 98 | Ht 61.0 in | Wt 232.0 lb

## 2015-11-10 DIAGNOSIS — F4310 Post-traumatic stress disorder, unspecified: Secondary | ICD-10-CM | POA: Diagnosis not present

## 2015-11-10 DIAGNOSIS — F331 Major depressive disorder, recurrent, moderate: Secondary | ICD-10-CM | POA: Diagnosis not present

## 2015-11-10 MED ORDER — ESCITALOPRAM OXALATE 20 MG PO TABS: 20.0000 mg | ORAL_TABLET | Freq: Every day | ORAL | Status: DC

## 2015-11-10 MED ORDER — DIAZEPAM 5 MG PO TABS: 5.0000 mg | ORAL_TABLET | Freq: Every day | ORAL | Status: AC | PRN
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
## 2016-02-07 ENCOUNTER — Ambulatory Visit (HOSPITAL_COMMUNITY): Payer: Self-pay | Admitting: Psychiatry

## 2016-02-22 ENCOUNTER — Other Ambulatory Visit (HOSPITAL_COMMUNITY): Payer: Self-pay

## 2016-02-22 DIAGNOSIS — F331 Major depressive disorder, recurrent, moderate: Secondary | ICD-10-CM

## 2016-02-22 MED ORDER — ESCITALOPRAM OXALATE 20 MG PO TABS: 20.0000 mg | ORAL_TABLET | Freq: Every day | ORAL | Status: AC

## 2016-03-13 ENCOUNTER — Ambulatory Visit (HOSPITAL_COMMUNITY): Payer: Self-pay | Admitting: Psychiatry

## 2016-07-08 IMAGING — CT CT ABD-PELV W/ CM
2 of 5 series · 16 of 46 positions shown, 18 images · IV contrast (OMNIPAQUE 300)
Comparison: CT of the abdomen and pelvis 06/29/2014.

CLINICAL DATA: Upper abdominal pain. Lower chest pain. Hematemesis.

EXAM:
CT ABDOMEN AND PELVIS WITH CONTRAST
TECHNIQUE: Multidetector CT imaging of the abdomen and pelvis was performed
using the standard protocol following bolus administration of
intravenous contrast.
CONTRAST:  100mL OMNIPAQUE IOHEXOL 300 MG/ML  SOLN

[Series 2: abd/pel with · axial · 0.79mm/px · z∈[+962,+1442]mm · 13 of 109 slices shown, 15 images]
[im 7/109  soft-tissue]
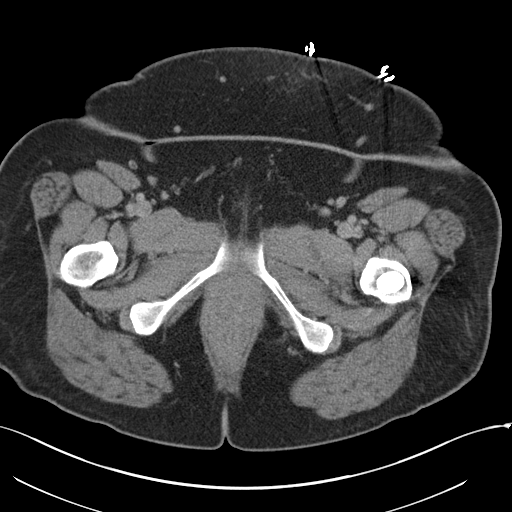
[im 7/109  bone]
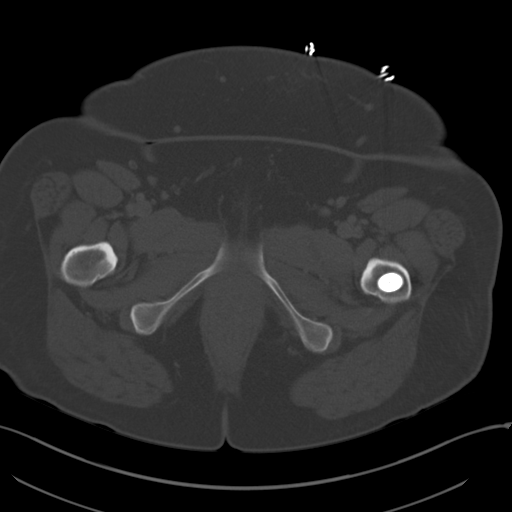
[im 13/109  soft-tissue]
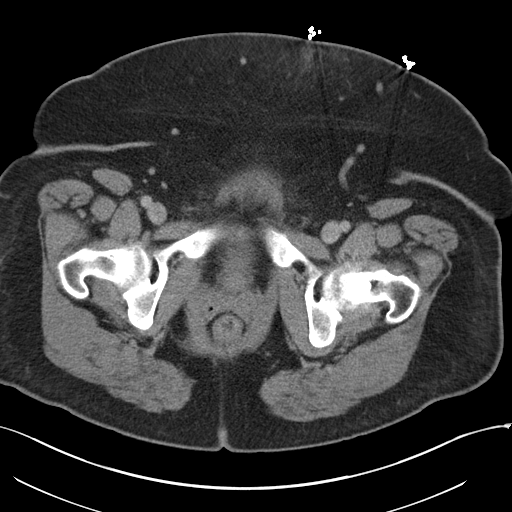
[im 25/109  soft-tissue]
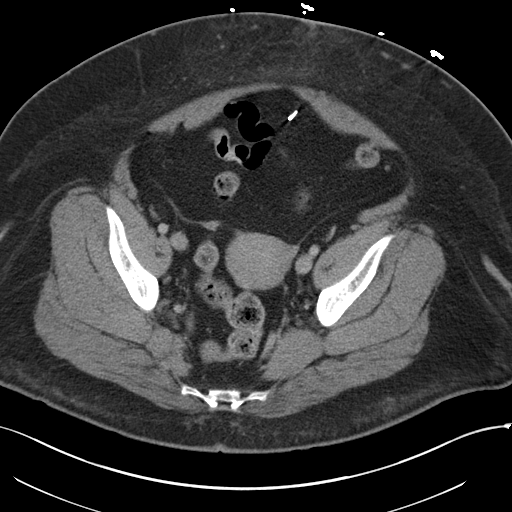
[im 31/109  soft-tissue]
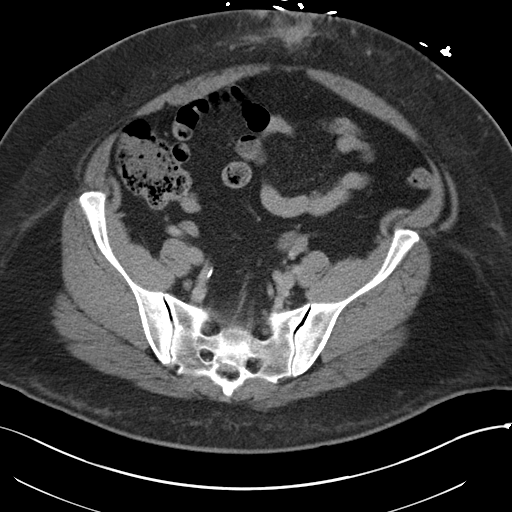
[im 37/109  soft-tissue]
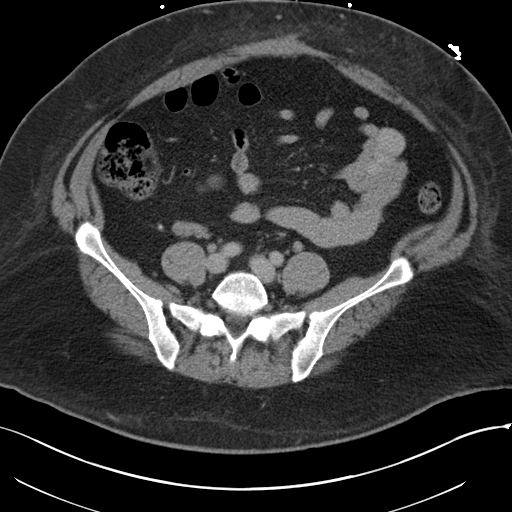
[im 49/109  soft-tissue]
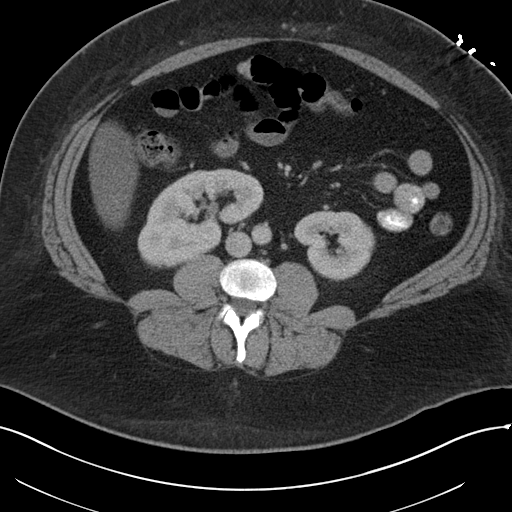
[im 55/109  soft-tissue]
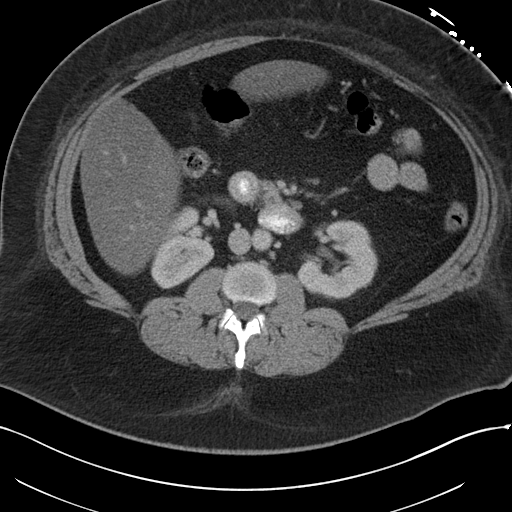
[im 61/109  soft-tissue]
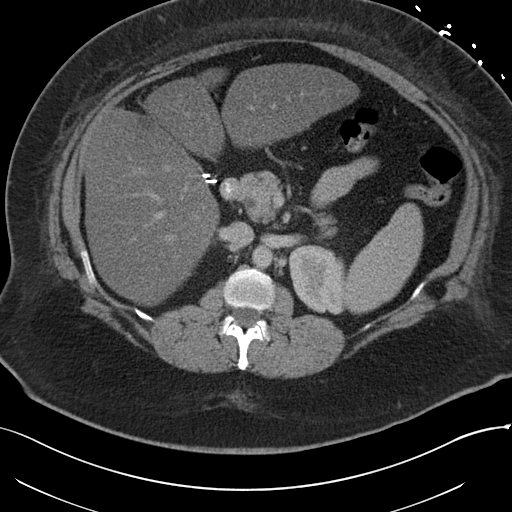
[im 73/109  soft-tissue]
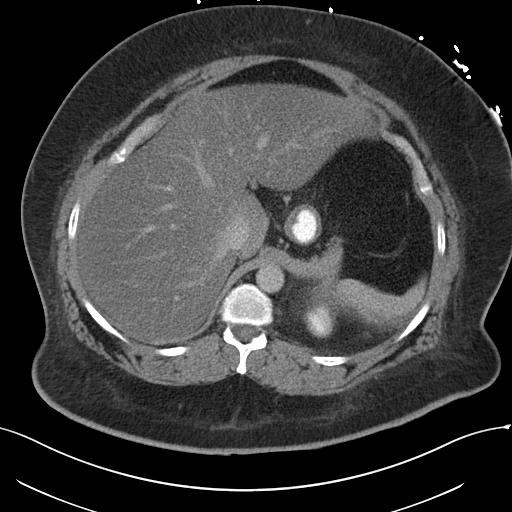
[im 73/109  bone]
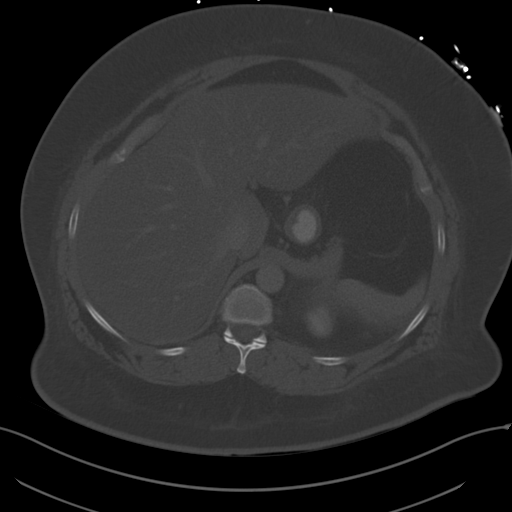
[im 79/109  soft-tissue]
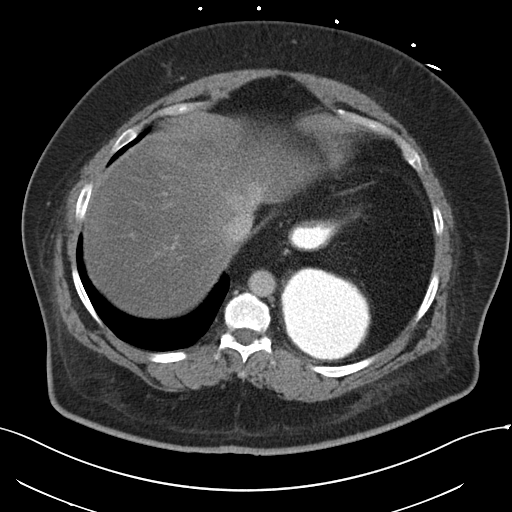
[im 85/109  soft-tissue]
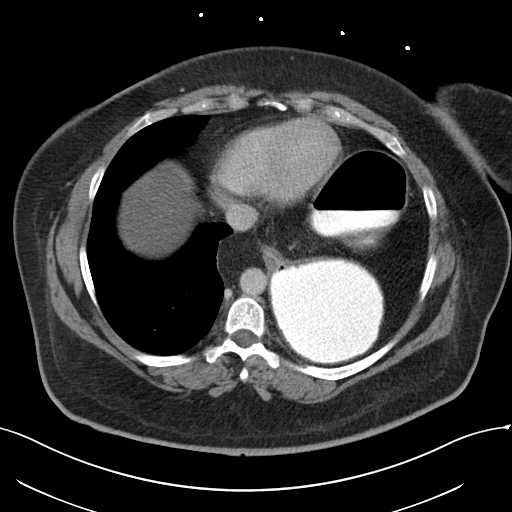
[im 97/109  soft-tissue]
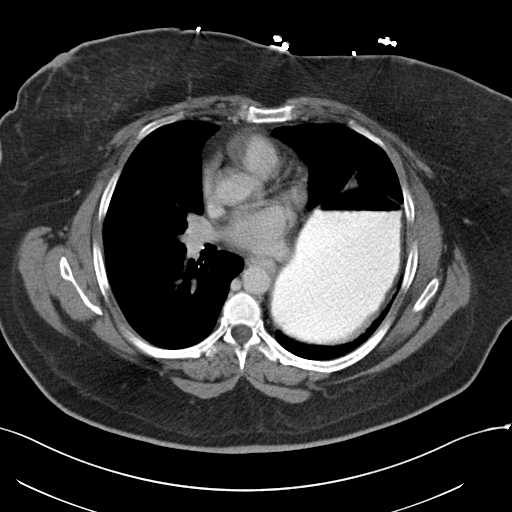
[im 103/109  soft-tissue]
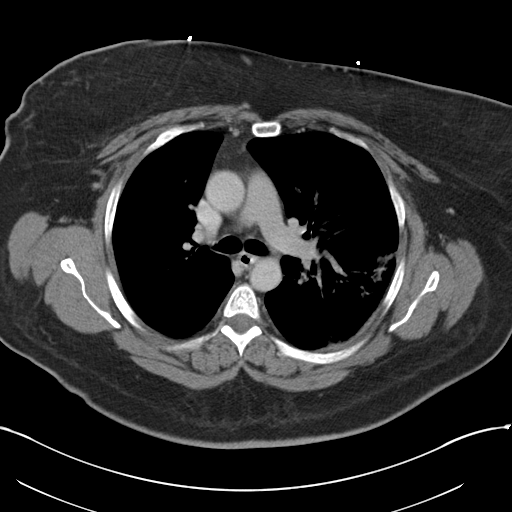

[Series 3: coronal a/|p · coronal · 0.74mm/px · 3 of 111 slices shown]
[im 37/111  soft-tissue]
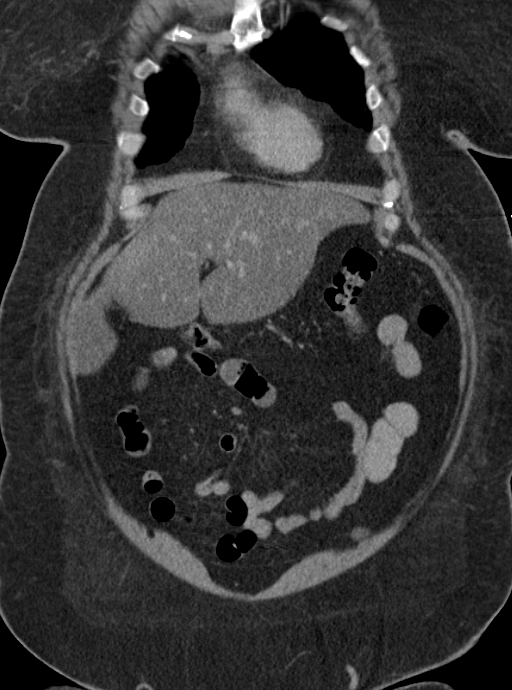
[im 49/111  soft-tissue]
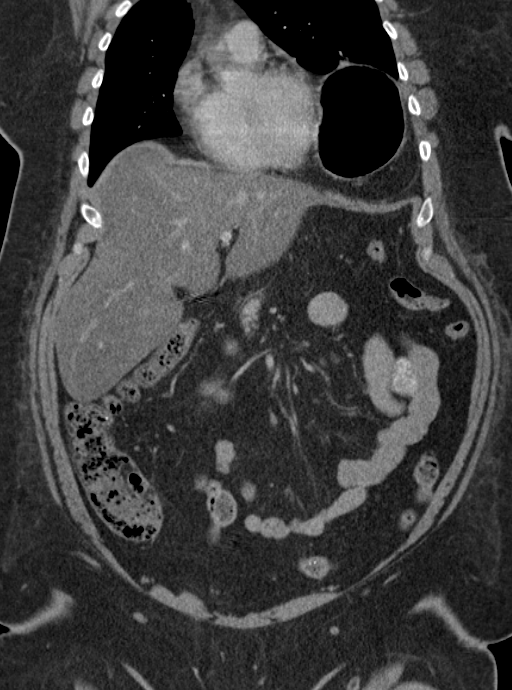
[im 62/111  soft-tissue]
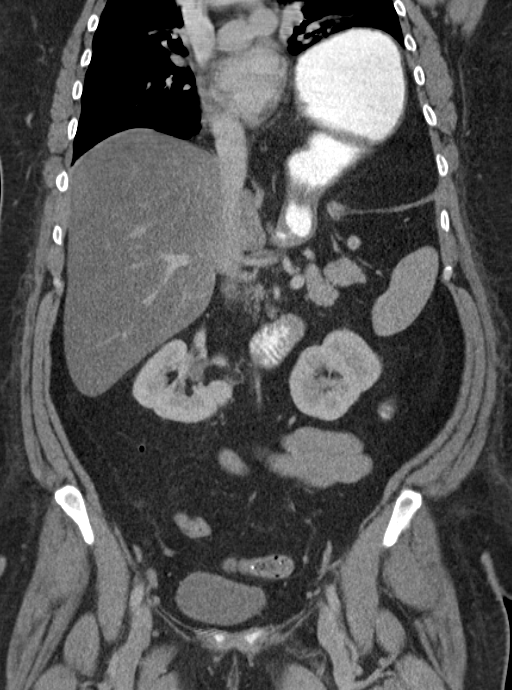

[16 of 46 positions shown; findings below may reference images not displayed]

FINDINGS: Chronic scarring and bronchiectasis is again seen at the left lung
base. The right lung base is clear. There is chronic elevation of
the left hemidiaphragm with mild gastric distention. The heart size
is normal. No significant pleural or pericardial effusion is
present.

Diffuse fatty infiltration of the liver is again noted. Spleen is
within normal limits. The duodenum and pancreas are unremarkable.
The common bile duct is within normal limits following
cholecystectomy. The adrenal glands are normal bilaterally. Kidneys
and ureters are within normal limits.

The rectosigmoid colon is within normal limits. The more proximal
colon is unremarkable. The appendix is visualized and normal. Uterus
and adnexa are within normal limits for age.

There is some residual subcutaneous stranding and scar tissue in the
region of the previously seen subcutaneous abscess. No discrete
fluid collection is present.

The bone windows are unremarkable.
IMPRESSION: 1. Mild stranding in scar tissue subjacent to the umbilicus with
resolution of the previously seen subcutaneous abscess.
2. Stable chronic elevation of left hemidiaphragm with scarring at
left lung base.
3. Moderate gastric distention as previously seen.
4. Stable diffuse fatty infiltration of the liver.

## 2017-11-05 ENCOUNTER — Encounter: Payer: Self-pay | Admitting: Internal Medicine

## 2018-11-08 NOTE — [facility] (Signed)
err
